# Patient Record
Sex: Female | Born: 1990
Health system: Southern US, Community
[De-identification: ages and names within clinical notes are randomized; demographics above are authoritative.]

## PROBLEM LIST (undated history)

## (undated) DIAGNOSIS — B009 Herpesviral infection, unspecified: Secondary | ICD-10-CM

## (undated) DIAGNOSIS — F419 Anxiety disorder, unspecified: Secondary | ICD-10-CM

## (undated) DIAGNOSIS — E079 Disorder of thyroid, unspecified: Secondary | ICD-10-CM

## (undated) DIAGNOSIS — F32A Depression, unspecified: Secondary | ICD-10-CM

## (undated) DIAGNOSIS — F329 Major depressive disorder, single episode, unspecified: Secondary | ICD-10-CM

## (undated) DIAGNOSIS — Z1159 Encounter for screening for other viral diseases: Secondary | ICD-10-CM

## (undated) HISTORY — PX: TONSILLECTOMY: SUR1361

## (undated) HISTORY — PX: TONSILECTOMY, ADENOIDECTOMY, BILATERAL MYRINGOTOMY AND TUBES: SHX2538

## (undated) HISTORY — DX: Encounter for screening for other viral diseases: Z11.59

## (undated) HISTORY — DX: Depression, unspecified: F32.A

## (undated) HISTORY — DX: Major depressive disorder, single episode, unspecified: F32.9

## (undated) HISTORY — DX: Anxiety disorder, unspecified: F41.9

## (undated) HISTORY — DX: Herpesviral infection, unspecified: B00.9

---
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
## 2014-08-11 ENCOUNTER — Ambulatory Visit
Admit: 2014-08-11 | Disposition: A | Discharge status: Home / Self Care | Payer: Self-pay | Attending: Unknown Physician Specialty | Admitting: Unknown Physician Specialty

## 2014-08-11 ENCOUNTER — Other Ambulatory Visit: Payer: Self-pay

## 2014-09-04 LAB — SURGICAL PATHOLOGY

## 2015-07-25 DIAGNOSIS — Z124 Encounter for screening for malignant neoplasm of cervix: Secondary | ICD-10-CM | POA: Diagnosis not present

## 2015-07-25 DIAGNOSIS — N63 Unspecified lump in breast: Secondary | ICD-10-CM | POA: Diagnosis not present

## 2015-07-26 DIAGNOSIS — Z01419 Encounter for gynecological examination (general) (routine) without abnormal findings: Secondary | ICD-10-CM | POA: Diagnosis not present

## 2015-08-06 DIAGNOSIS — Z30430 Encounter for insertion of intrauterine contraceptive device: Secondary | ICD-10-CM | POA: Diagnosis not present

## 2016-02-14 DIAGNOSIS — Z87898 Personal history of other specified conditions: Secondary | ICD-10-CM | POA: Diagnosis not present

## 2016-02-14 DIAGNOSIS — R110 Nausea: Secondary | ICD-10-CM | POA: Diagnosis not present

## 2016-03-11 DIAGNOSIS — F411 Generalized anxiety disorder: Secondary | ICD-10-CM | POA: Diagnosis not present

## 2016-03-11 DIAGNOSIS — J019 Acute sinusitis, unspecified: Secondary | ICD-10-CM | POA: Diagnosis not present

## 2016-06-01 DIAGNOSIS — M791 Myalgia: Secondary | ICD-10-CM | POA: Diagnosis not present

## 2016-06-01 DIAGNOSIS — J101 Influenza due to other identified influenza virus with other respiratory manifestations: Secondary | ICD-10-CM | POA: Diagnosis not present

## 2016-06-16 DIAGNOSIS — F411 Generalized anxiety disorder: Secondary | ICD-10-CM | POA: Diagnosis not present

## 2016-06-16 DIAGNOSIS — F331 Major depressive disorder, recurrent, moderate: Secondary | ICD-10-CM | POA: Diagnosis not present

## 2016-06-16 DIAGNOSIS — R002 Palpitations: Secondary | ICD-10-CM | POA: Diagnosis not present

## 2016-06-16 DIAGNOSIS — R5383 Other fatigue: Secondary | ICD-10-CM | POA: Diagnosis not present

## 2016-08-06 DIAGNOSIS — F411 Generalized anxiety disorder: Secondary | ICD-10-CM | POA: Diagnosis not present

## 2016-08-06 DIAGNOSIS — F331 Major depressive disorder, recurrent, moderate: Secondary | ICD-10-CM | POA: Diagnosis not present

## 2017-04-13 DIAGNOSIS — W108XXA Fall (on) (from) other stairs and steps, initial encounter: Secondary | ICD-10-CM | POA: Diagnosis not present

## 2017-04-13 DIAGNOSIS — M25512 Pain in left shoulder: Secondary | ICD-10-CM | POA: Diagnosis not present

## 2017-04-13 DIAGNOSIS — S4992XA Unspecified injury of left shoulder and upper arm, initial encounter: Secondary | ICD-10-CM | POA: Diagnosis not present

## 2017-04-14 NOTE — L&D Delivery Note (Signed)
Delivery Note At  1043 a viable and healthy female "Cassandra Stephens"  was delivered via  (Presentation:LOA ;  ).  APGAR: 9,10 ;   .   Placenta status: delivered intact with 3 vessel  Cord:  with the following complications: none  Anesthesia:  epidural Episiotomy:  none Lacerations:  bilateral superficial perineal (no repear needed) Suture Repair: NA Est. Blood Loss (mL):  300  Mom to postpartum.  Baby to Couplet care / Skin to Skin.  Melody N Shambley 03/31/2018, 10:57 AM

## 2017-06-24 DIAGNOSIS — Z01419 Encounter for gynecological examination (general) (routine) without abnormal findings: Secondary | ICD-10-CM | POA: Diagnosis not present

## 2017-06-24 DIAGNOSIS — Z124 Encounter for screening for malignant neoplasm of cervix: Secondary | ICD-10-CM | POA: Diagnosis not present

## 2017-06-24 DIAGNOSIS — N898 Other specified noninflammatory disorders of vagina: Secondary | ICD-10-CM | POA: Diagnosis not present

## 2017-06-25 DIAGNOSIS — Z13220 Encounter for screening for lipoid disorders: Secondary | ICD-10-CM | POA: Diagnosis not present

## 2017-06-25 DIAGNOSIS — Z1321 Encounter for screening for nutritional disorder: Secondary | ICD-10-CM | POA: Diagnosis not present

## 2017-06-25 DIAGNOSIS — Z136 Encounter for screening for cardiovascular disorders: Secondary | ICD-10-CM | POA: Diagnosis not present

## 2017-06-25 DIAGNOSIS — Z131 Encounter for screening for diabetes mellitus: Secondary | ICD-10-CM | POA: Diagnosis not present

## 2017-06-25 DIAGNOSIS — Z1329 Encounter for screening for other suspected endocrine disorder: Secondary | ICD-10-CM | POA: Diagnosis not present

## 2017-06-29 ENCOUNTER — Ambulatory Visit: Payer: BLUE CROSS/BLUE SHIELD | Admitting: Nurse Practitioner

## 2017-06-29 ENCOUNTER — Encounter: Payer: Self-pay | Admitting: Nurse Practitioner

## 2017-06-29 VITALS — BP 122/78 | HR 115 | Temp 98.4°F | Resp 16 | Ht 64.0 in | Wt 146.0 lb

## 2017-06-29 DIAGNOSIS — R319 Hematuria, unspecified: Secondary | ICD-10-CM

## 2017-06-29 DIAGNOSIS — R300 Dysuria: Secondary | ICD-10-CM | POA: Diagnosis not present

## 2017-06-29 DIAGNOSIS — N390 Urinary tract infection, site not specified: Secondary | ICD-10-CM | POA: Diagnosis not present

## 2017-06-29 DIAGNOSIS — J029 Acute pharyngitis, unspecified: Secondary | ICD-10-CM | POA: Insufficient documentation

## 2017-06-29 LAB — POCT URINALYSIS DIPSTICK
Bilirubin, UA: NEGATIVE
Glucose, UA: NEGATIVE
Nitrite, UA: NEGATIVE
Spec Grav, UA: 1.01 (ref 1.010–1.025)
Urobilinogen, UA: 0.2 E.U./dL
pH, UA: 6.5 (ref 5.0–8.0)

## 2017-06-29 MED ORDER — PHENAZOPYRIDINE HCL 200 MG PO TABS: 200.0000 mg | ORAL_TABLET | Freq: Three times a day (TID) | ORAL | 0 refills | Status: DC | PRN

## 2017-06-29 MED ORDER — AMOXICILLIN-POT CLAVULANATE 875-125 MG PO TABS: 1.0000 | ORAL_TABLET | Freq: Two times a day (BID) | ORAL | 0 refills | Status: DC

## 2017-06-29 NOTE — Progress Notes (Signed)
[facility] [address] [place], [place] [address]  Internal MEDICINE  Office Visit Note  Patient Name: Cassandra HarrisonHannah N Stephens  [birth year]  [id number]  Date of Service: 06/29/2017  Chief Complaint  Patient presents with  . Urinary Tract Infection  . Back Pain  . Chills  . Sore Throat     The patient is here for sick visit. Bladder and lower abdomen have been causing discomfort for several hours. Hurt to urinate. Did not notice any blood in urine. Has no nausea or vomiting .Does have back pain and a sore throat, but no fever.    Pt is here for a sick visit.     Current Medication:  Outpatient Encounter Medications as of 06/29/2017  Medication Sig  . ALPRAZolam (XANAX) 0.25 MG tablet Take 0.25 mg by mouth at bedtime as needed for anxiety.  . Prenatal Vit-Fe Fumarate-FA (PRENATAL MULTIVITAMIN) TABS tablet Take 1 tablet by mouth daily at 12 noon.  Marland Kitchen. amoxicillin-clavulanate (AUGMENTIN) 875-125 MG tablet Take 1 tablet by mouth 2 (two) times daily.  . phenazopyridine (PYRIDIUM) 200 MG tablet Take 1 tablet (200 mg total) by mouth 3 (three) times daily as needed for pain.   No facility-administered encounter medications on file as of 06/29/2017.       Medical History: Past Medical History:  Diagnosis Date  . Anxiety   . Depression      Today's Vitals   06/29/17 1003  BP: 122/78  Pulse: (!) 115  Resp: 16  Temp: 98.4 F (36.9 C)  SpO2: 97%  Weight: 146 lb (66.2 kg)  Height: 5\' 4"  (1.626 m)    Review of Systems  Constitutional: Positive for appetite change, chills and fatigue. Negative for unexpected weight change.  HENT: Positive for postnasal drip and sore throat. Negative for congestion, rhinorrhea and sneezing.   Eyes: Negative.  Negative for redness.  Respiratory: Negative for cough, chest tightness and shortness of breath.   Cardiovascular: Negative for chest pain and palpitations.  Gastrointestinal: Positive for abdominal pain. Negative for  constipation, diarrhea, nausea and vomiting.  Endocrine: Negative for cold intolerance, heat intolerance, polydipsia, polyphagia and polyuria.  Genitourinary: Positive for dysuria, flank pain, frequency and hematuria.  Musculoskeletal: Positive for back pain. Negative for arthralgias, joint swelling and neck pain.  Skin: Negative for rash.  Allergic/Immunologic: Negative for environmental allergies.  Neurological: Positive for headaches. Negative for tremors and numbness.  Hematological: Negative for adenopathy. Does not bruise/bleed easily.  Psychiatric/Behavioral: Negative for behavioral problems (Depression), sleep disturbance and suicidal ideas. The patient is not nervous/anxious.     Physical Exam  Constitutional: She is oriented to person, place, and time. She appears well-developed and well-nourished. No distress.  HENT:  Head: Normocephalic and atraumatic.  Mouth/Throat: Posterior oropharyngeal erythema present. No oropharyngeal exudate.  Eyes: EOM are normal. Pupils are equal, round, and reactive to light.  Neck: Normal range of motion. Neck supple. No JVD present. No tracheal deviation present. No thyromegaly present.  Cardiovascular: Normal rate, regular rhythm and normal heart sounds. Exam reveals no gallop and no friction rub.  No murmur heard. Pulmonary/Chest: Effort normal and breath sounds normal. No respiratory distress. She has no wheezes. She has no rales. She exhibits no tenderness.  Abdominal: Soft. Bowel sounds are normal.  Genitourinary:  Genitourinary Comments: Urine sample positive for large blood and small WBC  Musculoskeletal: Normal range of motion.  Lymphadenopathy:    She has cervical adenopathy.  Neurological: She is alert and oriented to person, place, and time.  No cranial nerve deficit.  Skin: Skin is warm and dry. She is not diaphoretic.  Psychiatric: She has a normal mood and affect. Her behavior is normal. Judgment and thought content normal.  Nursing  note and vitals reviewed.   Assessment/Plan: 1. Urinary tract infection with hematuria, site unspecified Start augmentin 875mg  bid for 10 days. Adjust as indicated after culture results available.  - CULTURE, URINE COMPREHENSIVE - amoxicillin-clavulanate (AUGMENTIN) 875-125 MG tablet; Take 1 tablet by mouth 2 (two) times daily.  Dispense: 20 tablet; Refill: 0  2. Dysuria Pyridium 200mg  may be used up to 3 times daily as needed for bladder pain and spasms.  - POCT Urinalysis Dipstick - phenazopyridine (PYRIDIUM) 200 MG tablet; Take 1 tablet (200 mg total) by mouth 3 (three) times daily as needed for pain.  Dispense: 10 tablet; Refill: 0  3. Pharyngitis, unspecified etiology Recommended she gargle with warm salt water as needed. Take tylenol and ibuprofen for pain. Rest and increase fluids.  General Counseling: darianny momon understanding of the findings of todays visit and agrees with plan of treatment. I have discussed any further diagnostic evaluation that may be needed or ordered today. We also reviewed her medications today. she has been encouraged to call the office with any questions or concerns that should arise related to todays visit.  This patient was seen by Vincent Gros, FNP- C in Collaboration with Dr Lyndon Code as a part of collaborative care agreement   Orders Placed This Encounter  Procedures  . CULTURE, URINE COMPREHENSIVE  . POCT Urinalysis Dipstick    Meds ordered this encounter  Medications  . phenazopyridine (PYRIDIUM) 200 MG tablet    Sig: Take 1 tablet (200 mg total) by mouth 3 (three) times daily as needed for pain.    Dispense:  10 tablet    Refill:  0    Order Specific Question:   Supervising Provider    Answer:   Lyndon Code [[id number]]  . amoxicillin-clavulanate (AUGMENTIN) 875-125 MG tablet    Sig: Take 1 tablet by mouth 2 (two) times daily.    Dispense:  20 tablet    Refill:  0    Order Specific Question:   Supervising Provider    Answer:    Lyndon Code [[id number]]    Time spent: 15 Minutes

## 2017-07-02 LAB — CULTURE, URINE COMPREHENSIVE

## 2017-07-30 DIAGNOSIS — Z0183 Encounter for blood typing: Secondary | ICD-10-CM | POA: Diagnosis not present

## 2017-07-30 DIAGNOSIS — N912 Amenorrhea, unspecified: Secondary | ICD-10-CM | POA: Diagnosis not present

## 2017-07-31 LAB — OB RESULTS CONSOLE ABO/RH: RH Type: POSITIVE

## 2017-07-31 LAB — OB RESULTS CONSOLE HGB/HCT, BLOOD
HCT: 40
Hemoglobin: 13.1

## 2017-07-31 LAB — OB RESULTS CONSOLE PLATELET COUNT: Platelets: 240

## 2017-07-31 LAB — OB RESULTS CONSOLE TSH: TSH: 4.11

## 2017-08-06 DIAGNOSIS — N912 Amenorrhea, unspecified: Secondary | ICD-10-CM | POA: Diagnosis not present

## 2017-08-18 DIAGNOSIS — N912 Amenorrhea, unspecified: Secondary | ICD-10-CM | POA: Diagnosis not present

## 2017-09-09 DIAGNOSIS — N912 Amenorrhea, unspecified: Secondary | ICD-10-CM | POA: Diagnosis not present

## 2017-09-21 ENCOUNTER — Ambulatory Visit (INDEPENDENT_AMBULATORY_CARE_PROVIDER_SITE_OTHER): Payer: BLUE CROSS/BLUE SHIELD | Admitting: Certified Nurse Midwife

## 2017-09-21 ENCOUNTER — Encounter: Payer: Self-pay | Admitting: Certified Nurse Midwife

## 2017-09-21 VITALS — BP 114/70 | HR 90 | Wt 152.5 lb

## 2017-09-21 DIAGNOSIS — Z3A12 12 weeks gestation of pregnancy: Secondary | ICD-10-CM | POA: Diagnosis not present

## 2017-09-21 NOTE — Progress Notes (Signed)
NEW OB HISTORY AND PHYSICAL  SUBJECTIVE:       Cassandra Stephens is a 27 y.o. No obstetric history on file. female, No LMP recorded (lmp unknown). Patient is pregnant., Estimated Date of Delivery: None noted., Unknown, presents today for establishment of Prenatal Care, [facility] PN records received.  She has no unusual complaints     Gynecologic History No LMP recorded (lmp unknown). Patient is pregnant. Unknown, u/s used for dating  Contraception: none Last Pap: 06/24/17. Results were: normal  Obstetric History OB History  Gravida Para Term Preterm AB Living  1            SAB TAB Ectopic Multiple Live Births               # Outcome Date GA Lbr Len/2nd Weight Sex Delivery Anes PTL Lv  1 Current             Past Medical History:  Diagnosis Date  . Anxiety   . Depression   . Herpes simplex virus (HSV) type I or type II DNA not detected by PCR     Past Surgical History:  Procedure Laterality Date  . TONSILLECTOMY Bilateral     Current Outpatient Medications on File Prior to Visit  Medication Sig Dispense Refill  . ALPRAZolam (XANAX) 0.25 MG tablet Take 0.25 mg by mouth at bedtime as needed for anxiety.    Marland Kitchen. amoxicillin-clavulanate (AUGMENTIN) 875-125 MG tablet Take 1 tablet by mouth 2 (two) times daily. 20 tablet 0  . phenazopyridine (PYRIDIUM) 200 MG tablet Take 1 tablet (200 mg total) by mouth 3 (three) times daily as needed for pain. 10 tablet 0  . Prenatal Vit-Fe Fumarate-FA (PRENATAL MULTIVITAMIN) TABS tablet Take 1 tablet by mouth daily at 12 noon.     No current facility-administered medications on file prior to visit.     No Known Allergies  Social History   Socioeconomic History  . Marital status: Single    Spouse name: Not on file  . Number of children: Not on file  . Years of education: Not on file  . Highest education level: Not on file  Occupational History  . Not on file  Social Needs  . Financial resource strain: Not on  file  . Food insecurity:    Worry: Not on file    Inability: Not on file  . Transportation needs:    Medical: Not on file    Non-medical: Not on file  Tobacco Use  . Smoking status: Never Smoker  . Smokeless tobacco: Never Used  Substance and Sexual Activity  . Alcohol use: Never    Frequency: Never  . Drug use: Never  . Sexual activity: Yes    Birth control/protection: None  Lifestyle  . Physical activity:    Days per week: Not on file    Minutes per session: Not on file  . Stress: Not on file  Relationships  . Social connections:    Talks on phone: Not on file    Gets together: Not on file    Attends religious service: Not on file    Active member of club or organization: Not on file    Attends meetings of clubs or organizations: Not on file    Relationship status: Not on file  . Intimate partner violence:    Fear of current or ex partner: Not on file    Emotionally abused: Not on file    Physically abused: Not on file  Forced sexual activity: Not on file  Other Topics Concern  . Not on file  Social History Narrative  . Not on file    Family History  Problem Relation Age of Onset  . Hyperlipidemia Father   . Hypertension Father   . Heart attack Father   . Diabetes Maternal Grandfather   . Diabetes Paternal Grandfather   . Heart disease Paternal Grandfather     The following portions of the patient's history were reviewed and updated as appropriate: allergies, current medications, past OB history, past medical history, past surgical history, past family history, past social history, and problem list.    OBJECTIVE: Initial Physical Exam (New OB)  GENERAL APPEARANCE: alert, well appearing, in no apparent distress, oriented to person, place and time HEAD: normocephalic, atraumatic MOUTH: mucous membranes moist, pharynx normal without lesions THYROID: no thyromegaly or masses present BREASTS: no masses noted, no significant tenderness, no palpable axillary  nodes, no skin changes LUNGS: clear to auscultation, no wheezes, rales or rhonchi, symmetric air entry HEART: regular rate and rhythm, no murmurs ABDOMEN: soft, nontender, nondistended, no abnormal masses, no epigastric pain EXTREMITIES: no redness or tenderness in the calves or thighs, no edema, no limitation in range of motion, intact peripheral pulses SKIN: normal coloration and turgor, no rashes LYMPH NODES: no adenopathy palpable NEUROLOGIC: alert, oriented, normal speech, no focal findings or movement disorder noted  PELVIC EXAM EXTERNAL GENITALIA: normal appearing vulva with no masses, tenderness or lesions VAGINA: no abnormal discharge or lesions CERVIX: no lesions or cervical motion tenderness UTERUS: gravid and consistent with 12 weeks ADNEXA: no masses palpable and nontender OB EXAM PELVIMETRY: appears adequate RECTUM: exam not indicated  ASSESSMENT: Normal pregnancy  PLAN: New OB counseling: The patient has been given an overview regarding routine prenatal care. Recommendations regarding diet, weight gain, and exercise in pregnancy were given. Prenatal testing, optional genetic testing, and ultrasound use in pregnancy were reviewed. PT states she had a gender test done " boy". Benefits of Breast Feeding were discussed. The patient is encouraged to consider nursing her baby post partum.  Doreene Burke, CNM

## 2017-09-21 NOTE — Progress Notes (Signed)
New pt is here for a NOB physical. [facility].

## 2017-09-21 NOTE — Patient Instructions (Signed)
Eating Plan for Pregnant Women While you are pregnant, your body will require additional nutrition to help support your growing baby. It is recommended that you consume:  150 additional calories each day during your first trimester.  300 additional calories each day during your second trimester.  300 additional calories each day during your third trimester.  Eating a healthy, well-balanced diet is very important for your health and for your baby's health. You also have a higher need for some vitamins and minerals, such as folic acid, calcium, iron, and vitamin D. What do I need to know about eating during pregnancy?  Do not try to lose weight or go on a diet during pregnancy.  Choose healthy, nutritious foods. Choose  of a sandwich with a glass of milk instead of a candy bar or a high-calorie sugar-sweetened beverage.  Limit your overall intake of foods that have "empty calories." These are foods that have little nutritional value, such as sweets, desserts, candies, sugar-sweetened beverages, and fried foods.  Eat a variety of foods, especially fruits and vegetables.  Take a prenatal vitamin to help meet the additional needs during pregnancy, specifically for folic acid, iron, calcium, and vitamin D.  Remember to stay active. Ask your health care provider for exercise recommendations that are specific to you.  Practice good food safety and cleanliness, such as washing your hands before you eat and after you prepare raw meat. This helps to prevent foodborne illnesses, such as listeriosis, that can be very dangerous for your baby. Ask your health care provider for more information about listeriosis. What does 150 extra calories look like? Healthy options for an additional 150 calories each day could be any of the following:  Plain low-fat yogurt (6-8 oz) with  cup of berries.  1 apple with 2 teaspoons of peanut butter.  Cut-up vegetables with  cup of hummus.  Low-fat chocolate milk  (8 oz or 1 cup).  1 string cheese with 1 medium orange.   of a peanut butter and jelly sandwich on whole-wheat bread (1 tsp of peanut butter).  For 300 calories, you could eat two of those healthy options each day. What is a healthy amount of weight to gain? The recommended amount of weight for you to gain is based on your pre-pregnancy BMI. If your pre-pregnancy BMI was:  Less than 18 (underweight), you should gain 28-40 lb.  18-24.9 (normal), you should gain 25-35 lb.  25-29.9 (overweight), you should gain 15-25 lb.  Greater than 30 (obese), you should gain 11-20 lb.  What if I am having twins or multiples? Generally, pregnant women who will be having twins or multiples may need to increase their daily calories by 300-600 calories each day. The recommended range for total weight gain is 25-54 lb, depending on your pre-pregnancy BMI. Talk with your health care provider for specific guidance about additional nutritional needs, weight gain, and exercise during your pregnancy. What foods can I eat? Grains Any grains. Try to choose whole grains, such as whole-wheat bread, oatmeal, or brown rice. Vegetables Any vegetables. Try to eat a variety of colors and types of vegetables to get a full range of vitamins and minerals. Remember to wash your vegetables well before eating. Fruits Any fruits. Try to eat a variety of colors and types of fruit to get a full range of vitamins and minerals. Remember to wash your fruits well before eating. Meats and Other Protein Sources Lean meats, including chicken, turkey, fish, and lean cuts of beef, veal,   or pork. Make sure that all meats are cooked to "well done." Tofu. Tempeh. Beans. Eggs. Peanut butter and other nut butters. Seafood, such as shrimp, crab, and lobster. If you choose fish, select types that are higher in omega-3 fatty acids, including salmon, herring, mussels, trout, sardines, and pollock. Make sure that all meats are cooked to food-safe  temperatures. Dairy Pasteurized milk and milk alternatives. Pasteurized yogurt and pasteurized cheese. Cottage cheese. Sour cream. Beverages Water. Juices that contain 100% fruit juice or vegetable juice. Caffeine-free teas and decaffeinated coffee. Drinks that contain caffeine are okay to drink, but it is better to avoid caffeine. Keep your total caffeine intake to less than 200 mg each day (12 oz of coffee, tea, or soda) or as directed by your health care provider. Condiments Any pasteurized condiments. Sweets and Desserts Any sweets and desserts. Fats and Oils Any fats and oils. The items listed above may not be a complete list of recommended foods or beverages. Contact your dietitian for more options. What foods are not recommended? Vegetables Unpasteurized (raw) vegetable juices. Fruits Unpasteurized (raw) fruit juices. Meats and Other Protein Sources Cured meats that have nitrates, such as bacon, salami, and hotdogs. Luncheon meats, bologna, or other deli meats (unless they are reheated until they are steaming hot). Refrigerated pate, meat spreads from a meat counter, smoked seafood that is found in the refrigerated section of a store. Raw fish, such as sushi or sashimi. High mercury content fish, such as tilefish, shark, swordfish, and king mackerel. Raw meats, such as tuna or beef tartare. Undercooked meats and poultry. Make sure that all meats are cooked to food-safe temperatures. Dairy Unpasteurized (raw) milk and any foods that have raw milk in them. Soft cheeses, such as feta, queso blanco, queso fresco, Brie, Camembert cheeses, blue-veined cheeses, and Panela cheese (unless it is made with pasteurized milk, which must be stated on the label). Beverages Alcohol. Sugar-sweetened beverages, such as sodas, teas, or energy drinks. Condiments Homemade fermented foods and drinks, such as pickles, sauerkraut, or kombucha drinks. (Store-bought pasteurized versions of these are  okay.) Other Salads that are made in the store, such as ham salad, chicken salad, egg salad, tuna salad, and seafood salad. The items listed above may not be a complete list of foods and beverages to avoid. Contact your dietitian for more information. This information is not intended to replace advice given to you by your health care provider. Make sure you discuss any questions you have with your health care provider. Document Released: 01/13/2014 Document Revised: 09/06/2015 Document Reviewed: 09/13/2013 [employer] Interactive Patient Education  2018 [employer] Prenatal Care WHAT IS PRENATAL CARE? Prenatal care is the process of caring for a pregnant woman before she gives birth. Prenatal care makes sure that she and her baby remain as healthy as possible throughout pregnancy. Prenatal care may be provided by a midwife, family practice health care provider, or a childbirth and pregnancy specialist (obstetrician). Prenatal care may include physical examinations, testing, treatments, and education on nutrition, lifestyle, and social support services. WHY IS PRENATAL CARE SO IMPORTANT? Early and consistent prenatal care increases the chance that you and your baby will remain healthy throughout your pregnancy. This type of care also decreases a baby's risk of being born too early (prematurely), or being born smaller than expected (small for gestational age). Any underlying medical conditions you may have that could pose a risk during your pregnancy are discussed during prenatal care visits. You will also be monitored regularly for any new   conditions that may arise during your pregnancy so they can be treated quickly and effectively. WHAT HAPPENS DURING PRENATAL CARE VISITS? Prenatal care visits may include the following: Discussion Tell your health care provider about any new signs or symptoms you have experienced since your last visit. These might include:  Nausea or vomiting.  Increased or  decreased level of energy.  Difficulty sleeping.  Back or leg pain.  Weight changes.  Frequent urination.  Shortness of breath with physical activity.  Changes in your skin, such as the development of a rash or itchiness.  Vaginal discharge or bleeding.  Feelings of excitement or nervousness.  Changes in your baby's movements.  You may want to write down any questions or topics you want to discuss with your health care provider and bring them with you to your appointment. Examination During your first prenatal care visit, you will likely have a complete physical exam. Your health care provider will often examine your vagina, cervix, and the position of your uterus, as well as check your heart, lungs, and other body systems. As your pregnancy progresses, your health care provider will measure the size of your uterus and your baby's position inside your uterus. He or she may also examine you for early signs of labor. Your prenatal visits may also include checking your blood pressure and, after about 10-12 weeks of pregnancy, listening to your baby's heartbeat. Testing Regular testing often includes:  Urinalysis. This checks your urine for glucose, protein, or signs of infection.  Blood count. This checks the levels of white and red blood cells in your body.  Tests for sexually transmitted infections (STIs). Testing for STIs at the beginning of pregnancy is routinely done and is required in many states.  Antibody testing. You will be checked to see if you are immune to certain illnesses, such as rubella, that can affect a developing fetus.  Glucose screen. Around 24-28 weeks of pregnancy, your blood glucose level will be checked for signs of gestational diabetes. Follow-up tests may be recommended.  Group B strep. This is a bacteria that is commonly found inside a woman's vagina. This test will inform your health care provider if you need an antibiotic to reduce the amount of this  bacteria in your body prior to labor and childbirth.  Ultrasound. Many pregnant women undergo an ultrasound screening around 18-20 weeks of pregnancy to evaluate the health of the fetus and check for any developmental abnormalities.  HIV (human immunodeficiency virus) testing. Early in your pregnancy, you will be screened for HIV. If you are at high risk for HIV, this test may be repeated during your third trimester of pregnancy.  You may be offered other testing based on your age, personal or family medical history, or other factors. HOW OFTEN SHOULD I PLAN TO SEE MY HEALTH CARE PROVIDER FOR PRENATAL CARE? Your prenatal care check-up schedule depends on any medical conditions you have before, or develop during, your pregnancy. If you do not have any underlying medical conditions, you will likely be seen for checkups:  Monthly, during the first 6 months of pregnancy.  Twice a month during months 7 and 8 of pregnancy.  Weekly starting in the 9th month of pregnancy and until delivery.  If you develop signs of early labor or other concerning signs or symptoms, you may need to see your health care provider more often. Ask your health care provider what prenatal care schedule is best for you. WHAT CAN I DO TO KEEP MYSELF AND   MY BABY AS HEALTHY AS POSSIBLE DURING MY PREGNANCY?  Take a prenatal vitamin containing 400 micrograms (0.4 mg) of folic acid every day. Your health care provider may also ask you to take additional vitamins such as iodine, vitamin D, iron, copper, and zinc.  Take 1500-2000 mg of calcium daily starting at your 20th week of pregnancy until you deliver your baby.  Make sure you are up to date on your vaccinations. Unless directed otherwise by your health care provider: ? You should receive a tetanus, diphtheria, and pertussis (Tdap) vaccination between the 27th and 36th week of your pregnancy, regardless of when your last Tdap immunization occurred. This helps protect your baby  from whooping cough (pertussis) after he or she is born. ? You should receive an annual inactivated influenza vaccine (IIV) to help protect you and your baby from influenza. This can be done at any point during your pregnancy.  Eat a well-rounded diet that includes: ? Fresh fruits and vegetables. ? Lean proteins. ? Calcium-rich foods such as milk, yogurt, hard cheeses, and dark, leafy greens. ? Whole grain breads.  Do noteat seafood high in mercury, including: ? Swordfish. ? Tilefish. ? Shark. ? King mackerel. ? More than 6 oz tuna per week.  Do not eat: ? Raw or undercooked meats or eggs. ? Unpasteurized foods, such as soft cheeses (brie, blue, or feta), juices, and milks. ? Lunch meats. ? Hot dogs that have not been heated until they are steaming.  Drink enough water to keep your urine clear or pale yellow. For many women, this may be 10 or more 8 oz glasses of water each day. Keeping yourself hydrated helps deliver nutrients to your baby and may prevent the start of pre-term uterine contractions.  Do not use any tobacco products including cigarettes, chewing tobacco, or electronic cigarettes. If you need help quitting, ask your health care provider.  Do not drink beverages containing alcohol. No safe level of alcohol consumption during pregnancy has been determined.  Do not use any illegal drugs. These can harm your developing baby or cause a miscarriage.  Ask your health care provider or pharmacist before taking any prescription or over-the-counter medicines, herbs, or supplements.  Limit your caffeine intake to no more than 200 mg per day.  Exercise. Unless told otherwise by your health care provider, try to get 30 minutes of moderate exercise most days of the week. Do not  do high-impact activities, contact sports, or activities with a high risk of falling, such as horseback riding or downhill skiing.  Get plenty of rest.  Avoid anything that raises your body temperature,  such as hot tubs and saunas.  If you own a cat, do not empty its litter box. Bacteria contained in cat feces can cause an infection called toxoplasmosis. This can result in serious harm to the fetus.  Stay away from chemicals such as insecticides, lead, mercury, and cleaning or paint products that contain solvents.  Do not have any X-rays taken unless medically necessary.  Take a childbirth and breastfeeding preparation class. Ask your health care provider if you need a referral or recommendation.  This information is not intended to replace advice given to you by your health care provider. Make sure you discuss any questions you have with your health care provider. Document Released: 04/03/2003 Document Revised: 09/03/2015 Document Reviewed: 06/15/2013 [employer] Interactive Patient Education  2017 [employer]  

## 2017-10-08 ENCOUNTER — Encounter: Payer: Self-pay | Admitting: Certified Nurse Midwife

## 2017-10-08 DIAGNOSIS — L03213 Periorbital cellulitis: Secondary | ICD-10-CM | POA: Diagnosis not present

## 2017-10-09 ENCOUNTER — Encounter (INDEPENDENT_AMBULATORY_CARE_PROVIDER_SITE_OTHER): Payer: Self-pay

## 2017-10-22 ENCOUNTER — Ambulatory Visit (INDEPENDENT_AMBULATORY_CARE_PROVIDER_SITE_OTHER): Payer: BLUE CROSS/BLUE SHIELD | Admitting: Certified Nurse Midwife

## 2017-10-22 VITALS — BP 97/62 | HR 71 | Wt 159.2 lb

## 2017-10-22 DIAGNOSIS — Z3402 Encounter for supervision of normal first pregnancy, second trimester: Secondary | ICD-10-CM | POA: Diagnosis not present

## 2017-10-22 DIAGNOSIS — Z3482 Encounter for supervision of other normal pregnancy, second trimester: Secondary | ICD-10-CM | POA: Diagnosis not present

## 2017-10-22 NOTE — Progress Notes (Signed)
Pt is here for an ROB visit. 

## 2017-10-22 NOTE — Progress Notes (Signed)
ROB-Doing well, no questions or concerns. Anticipatory guidance regarding the course of prenatal care. Reviewed red flag symptoms and when to call. RTC x 4 weeks for anatomy scan and ROB or sooner if needed.

## 2017-10-22 NOTE — Patient Instructions (Signed)
Common Medications Safe in Pregnancy  Acne:      Constipation:  Benzoyl Peroxide     Colace  Clindamycin      Dulcolax Suppository  Topica Erythromycin     Fibercon  Salicylic Acid      Metamucil         Miralax AVOID:        Senakot   Accutane    Cough:  Retin-A       Cough Drops  Tetracycline      Phenergan w/ Codeine if Rx  Minocycline      Robitussin (Plain & DM)  Antibiotics:     Crabs/Lice:  Ceclor       RID  Cephalosporins    AVOID:  E-Mycins      Kwell  Keflex  Macrobid/Macrodantin   Diarrhea:  Penicillin      Kao-Pectate  Zithromax      Imodium AD         PUSH FLUIDS AVOID:       Cipro     Fever:  Tetracycline      Tylenol (Regular or Extra  Minocycline       Strength)  Levaquin      Extra Strength-Do not          Exceed 8 tabs/24 hrs Caffeine:        <200mg/day (equiv. To 1 cup of coffee or  approx. 3 12 oz sodas)         Gas: Cold/Hayfever:       Gas-X  Benadryl      Mylicon  Claritin       Phazyme  **Claritin-D        Chlor-Trimeton    Headaches:  Dimetapp      ASA-Free Excedrin  Drixoral-Non-Drowsy     Cold Compress  Mucinex (Guaifenasin)     Tylenol (Regular or Extra  Sudafed/Sudafed-12 Hour     Strength)  **Sudafed PE Pseudoephedrine   Tylenol Cold & Sinus     Vicks Vapor Rub  Zyrtec  **AVOID if Problems With Blood Pressure         Heartburn: Avoid lying down for at least 1 hour after meals  Aciphex      Maalox     Rash:  Milk of Magnesia     Benadryl    Mylanta       1% Hydrocortisone Cream  Pepcid  Pepcid Complete   Sleep Aids:  Prevacid      Ambien   Prilosec       Benadryl  Rolaids       Chamomile Tea  Tums (Limit 4/day)     Unisom  Zantac       Tylenol PM         Warm milk-add vanilla or  Hemorrhoids:       Sugar for taste  Anusol/Anusol H.C.  (RX: Analapram 2.5%)  Sugar Substitutes:  Hydrocortisone OTC     Ok in moderation  Preparation H      Tucks        Vaseline lotion applied to tissue with  wiping    Herpes:     Throat:  Acyclovir      Oragel  Famvir  Valtrex     Vaccines:         Flu Shot Leg Cramps:       *Gardasil  Benadryl      Hepatitis A         Hepatitis B Nasal Spray:         Pneumovax  Saline Nasal Spray     Polio Booster         Tetanus Nausea:       Tuberculosis test or PPD  Vitamin B6 25 mg TID   AVOID:    Dramamine      *Gardasil  Emetrol       Live Poliovirus  Ginger Root 250 mg QID    MMR (measles, mumps &  High Complex Carbs @ Bedtime    rebella)  Sea Bands-Accupressure    Varicella (Chickenpox)  Unisom 1/2 tab TID     *No known complications           If received before Pain:         Known pregnancy;   Darvocet       Resume series after  Lortab        Delivery  Percocet    Yeast:   Tramadol      Femstat  Tylenol 3      Gyne-lotrimin  Ultram       Monistat  Vicodin           MISC:         All Sunscreens           Hair Coloring/highlights          Insect Repellant's          (Including DEET)         Mystic Tans Back Pain in Pregnancy Back pain during pregnancy is common. Back pain may be caused by several factors that are related to changes during your pregnancy. Follow these instructions at home: Managing pain, stiffness, and swelling  If directed, apply ice for sudden (acute) back pain. ? Put ice in a plastic bag. ? Place a towel between your skin and the bag. ? Leave the ice on for 20 minutes, 2-3 times per day.  If directed, apply heat to the affected area before you exercise: ? Place a towel between your skin and the heat pack or heating pad. ? Leave the heat on for 20-30 minutes. ? Remove the heat if your skin turns bright red. This is especially important if you are unable to feel pain, heat, or cold. You may have a greater risk of getting burned. Activity  Exercise as told by your health care provider. Exercising is the best way to prevent or manage back pain.  Listen to your body when lifting. If lifting hurts, ask for help or  bend your knees. This uses your leg muscles instead of your back muscles.  Squat down when picking up something from the floor. Do not bend over.  Only use bed rest as told by your health care provider. Bed rest should only be used for the most severe episodes of back pain. Standing, Sitting, and Lying Down  Do not stand in one place for long periods of time.  Use good posture when sitting. Make sure your head rests over your shoulders and is not hanging forward. Use a pillow on your lower back if necessary.  Try sleeping on your side, preferably the left side, with a pillow or two between your legs. If you are sore after a night's rest, your bed may be too soft. A firm mattress may provide more support for your back during pregnancy. General instructions  Do not wear high heels.  Eat a healthy diet. Try to gain weight within your health care provider's recommendations.  Use a maternity girdle, elastic sling, or   back brace as told by your health care provider.  Take over-the-counter and prescription medicines only as told by your health care provider.  Keep all follow-up visits as told by your health care provider. This is important. This includes any visits with any specialists, such as a physical therapist. Contact a health care provider if:  Your back pain interferes with your daily activities.  You have increasing pain in other parts of your body. Get help right away if:  You develop numbness, tingling, weakness, or problems with the use of your arms or legs.  You develop severe back pain that is not controlled with medicine.  You have a sudden change in bowel or bladder control.  You develop shortness of breath, dizziness, or you faint.  You develop nausea, vomiting, or sweating.  You have back pain that is a rhythmic, cramping pain similar to labor pains. Labor pain is usually 1-2 minutes apart, lasts for about 1 minute, and involves a bearing down feeling or pressure in  your pelvis.  You have back pain and your water breaks or you have vaginal bleeding.  You have back pain or numbness that travels down your leg.  Your back pain developed after you fell.  You develop pain on one side of your back.  You see blood in your urine.  You develop skin blisters in the area of your back pain. This information is not intended to replace advice given to you by your health care provider. Make sure you discuss any questions you have with your health care provider. Document Released: 07/09/2005 Document Revised: 09/06/2015 Document Reviewed: 12/13/2014 Elsevier Interactive Patient Education  2018 [employer]. Abdominal Pain During Pregnancy Belly (abdominal) pain is common during pregnancy. Most of the time, it is not a serious problem. Other times, it can be a sign that something is wrong with the pregnancy. Always tell your doctor if you have belly pain. Follow these instructions at home: Monitor your belly pain for any changes. The following actions may help you feel better:  Do not have sex (intercourse) or put anything in your vagina until you feel better.  Rest until your pain stops.  Drink clear fluids if you feel sick to your stomach (nauseous). Do not eat solid food until you feel better.  Only take medicine as told by your doctor.  Keep all doctor visits as told.  Get help right away if:  You are bleeding, leaking fluid, or pieces of tissue come out of your vagina.  You have more pain or cramping.  You keep throwing up (vomiting).  You have pain when you pee (urinate) or have blood in your pee.  You have a fever.  You do not feel your baby moving as much.  You feel very weak or feel like passing out.  You have trouble breathing, with or without belly pain.  You have a very bad headache and belly pain.  You have fluid leaking from your vagina and belly pain.  You keep having watery poop (diarrhea).  Your belly pain does not go away  after resting, or the pain gets worse. This information is not intended to replace advice given to you by your health care provider. Make sure you discuss any questions you have with your health care provider. Document Released: 03/19/2009 Document Revised: 11/07/2015 Document Reviewed: 10/28/2012 Elsevier Interactive Patient Education  2018 [employer]. Round Ligament Pain The round ligament is a cord of muscle and tissue that helps to support the uterus.  It can become a source of pain during pregnancy if it becomes stretched or twisted as the baby grows. The pain usually begins in the second trimester of pregnancy, and it can come and go until the baby is delivered. It is not a serious problem, and it does not cause harm to the baby. Round ligament pain is usually a short, sharp, and pinching pain, but it can also be a dull, lingering, and aching pain. The pain is felt in the lower side of the abdomen or in the groin. It usually starts deep in the groin and moves up to the outside of the hip area. Pain can occur with:  A sudden change in position.  Rolling over in bed.  Coughing or sneezing.  Physical activity.  Follow these instructions at home: Watch your condition for any changes. Take these steps to help with your pain:  When the pain starts, relax. Then try: ? Sitting down. ? Flexing your knees up to your abdomen. ? Lying on your side with one pillow under your abdomen and another pillow between your legs. ? Sitting in a warm bath for 15-20 minutes or until the pain goes away.  Take over-the-counter and prescription medicines only as told by your health care provider.  Move slowly when you sit and stand.  Avoid long walks if they cause pain.  Stop or lessen your physical activities if they cause pain.  Contact a health care provider if:  Your pain does not go away with treatment.  You feel pain in your back that you did not have before.  Your medicine is not  helping. Get help right away if:  You develop a fever or chills.  You develop uterine contractions.  You develop vaginal bleeding.  You develop nausea or vomiting.  You develop diarrhea.  You have pain when you urinate. This information is not intended to replace advice given to you by your health care provider. Make sure you discuss any questions you have with your health care provider. Document Released: 01/08/2008 Document Revised: 09/06/2015 Document Reviewed: 06/07/2014 Elsevier Interactive Patient Education  [employer].

## 2017-10-23 LAB — HEPATITIS B SURFACE ANTIGEN: Hepatitis B Surface Ag: NEGATIVE

## 2017-10-23 LAB — URINALYSIS, ROUTINE W REFLEX MICROSCOPIC
Bilirubin, UA: NEGATIVE
Glucose, UA: NEGATIVE
Ketones, UA: NEGATIVE
Leukocytes, UA: NEGATIVE
Nitrite, UA: NEGATIVE
Protein, UA: NEGATIVE
RBC, UA: NEGATIVE
Specific Gravity, UA: 1.018 (ref 1.005–1.030)
Urobilinogen, Ur: 0.2 mg/dL (ref 0.2–1.0)
pH, UA: 7 (ref 5.0–7.5)

## 2017-10-23 LAB — CBC WITH DIFFERENTIAL
Basophils Absolute: 0 10*3/uL (ref 0.0–0.2)
Basos: 0 %
EOS (ABSOLUTE): 0.2 10*3/uL (ref 0.0–0.4)
Eos: 2 %
Hematocrit: 37.6 % (ref 34.0–46.6)
Hemoglobin: 12.2 g/dL (ref 11.1–15.9)
Immature Grans (Abs): 0 10*3/uL (ref 0.0–0.1)
Immature Granulocytes: 0 %
Lymphocytes Absolute: 2.5 10*3/uL (ref 0.7–3.1)
Lymphs: 25 %
MCH: 29.5 pg (ref 26.6–33.0)
MCHC: 32.4 g/dL (ref 31.5–35.7)
MCV: 91 fL (ref 79–97)
Monocytes Absolute: 0.9 10*3/uL (ref 0.1–0.9)
Monocytes: 9 %
Neutrophils Absolute: 6.4 10*3/uL (ref 1.4–7.0)
Neutrophils: 64 %
RBC: 4.13 x10E6/uL (ref 3.77–5.28)
RDW: 13.1 % (ref 12.3–15.4)
WBC: 10 10*3/uL (ref 3.4–10.8)

## 2017-10-23 LAB — RPR: RPR Ser Ql: NONREACTIVE

## 2017-10-23 LAB — ABO AND RH: Rh Factor: POSITIVE

## 2017-10-23 LAB — VARICELLA ZOSTER ANTIBODY, IGG: Varicella zoster IgG: 2055 index (ref >165)

## 2017-10-23 LAB — ANTIBODY SCREEN: Antibody Screen: NEGATIVE

## 2017-10-23 LAB — HIV ANTIBODY (ROUTINE TESTING W REFLEX): HIV Screen 4th Generation wRfx: NONREACTIVE

## 2017-10-23 LAB — RUBELLA SCREEN: Rubella Antibodies, IGG: 1.45 index (ref >0.99)

## 2017-10-24 LAB — MONITOR DRUG PROFILE 14(MW)
Amphetamine Scrn, Ur: NEGATIVE ng/mL
BARBITURATE SCREEN URINE: NEGATIVE ng/mL
BENZODIAZEPINE SCREEN, URINE: NEGATIVE ng/mL
Buprenorphine, Urine: NEGATIVE ng/mL
CANNABINOIDS UR QL SCN: NEGATIVE ng/mL
Cocaine (Metab) Scrn, Ur: NEGATIVE ng/mL
Creatinine(Crt), U: 75.1 mg/dL (ref 20.0–300.0)
Fentanyl, Urine: NEGATIVE pg/mL
Meperidine Screen, Urine: NEGATIVE ng/mL
Methadone Screen, Urine: NEGATIVE ng/mL
OXYCODONE+OXYMORPHONE UR QL SCN: NEGATIVE ng/mL
Opiate Scrn, Ur: NEGATIVE ng/mL
Ph of Urine: 7 (ref 4.5–8.9)
Phencyclidine Qn, Ur: NEGATIVE ng/mL
Propoxyphene Scrn, Ur: NEGATIVE ng/mL
SPECIFIC GRAVITY: 1.015
Tramadol Screen, Urine: NEGATIVE ng/mL

## 2017-10-24 LAB — URINE CULTURE

## 2017-10-24 LAB — GC/CHLAMYDIA PROBE AMP
Chlamydia trachomatis, NAA: NEGATIVE
Neisseria gonorrhoeae by PCR: NEGATIVE

## 2017-10-24 LAB — NICOTINE SCREEN, URINE: Cotinine Ql Scrn, Ur: NEGATIVE ng/mL

## 2017-11-10 ENCOUNTER — Encounter: Payer: Self-pay | Admitting: Certified Nurse Midwife

## 2017-11-18 ENCOUNTER — Other Ambulatory Visit (INDEPENDENT_AMBULATORY_CARE_PROVIDER_SITE_OTHER): Payer: BLUE CROSS/BLUE SHIELD

## 2017-11-18 ENCOUNTER — Ambulatory Visit (INDEPENDENT_AMBULATORY_CARE_PROVIDER_SITE_OTHER): Payer: BLUE CROSS/BLUE SHIELD | Admitting: Certified Nurse Midwife

## 2017-11-18 ENCOUNTER — Encounter: Payer: Self-pay | Admitting: Certified Nurse Midwife

## 2017-11-18 ENCOUNTER — Other Ambulatory Visit: Payer: Self-pay | Admitting: Certified Nurse Midwife

## 2017-11-18 VITALS — BP 107/67 | HR 85 | Wt 164.6 lb

## 2017-11-18 DIAGNOSIS — Z363 Encounter for antenatal screening for malformations: Secondary | ICD-10-CM | POA: Diagnosis not present

## 2017-11-18 DIAGNOSIS — Z3689 Encounter for other specified antenatal screening: Secondary | ICD-10-CM

## 2017-11-18 DIAGNOSIS — Z3402 Encounter for supervision of normal first pregnancy, second trimester: Secondary | ICD-10-CM

## 2017-11-18 NOTE — Progress Notes (Signed)
Pt is here for an anatomy scan and ROB.

## 2017-11-18 NOTE — Progress Notes (Signed)
   ULTRASOUND REPORT  Location: [facility] Date of Service:  11/18/2017 ROB doing well. No complaints. Anatomy scan today ( see below). Reviewed round ligament pain. Follow up 4 wks.   Doreene BurkeAnnie Akshay Spang, CNM  Indications: Anatomy Findings:  Singleton intrauterine pregnancy is visualized with FHR at 143 BPM. Biometrics give an (U/S) Gestational age of 25 3/7 weeks and an (U/S) EDD of 04/04/18; this correlates with the clinically established EDD of 04/03/18.  Fetal presentation is breech.  EFW: 360 grams (0lb 13oz). Placenta: Posterior and grade 1. AFI: WNL subjectively.  Anatomic survey is complete and appears WNL; Gender - Female.   Right Ovary measures 2.4 x 2.1 x 1.3 cm. It is normal in appearance. Left Ovary was not visualized. There is no obvious evidence of a corpus luteal cyst. Survey of the adnexa demonstrates no adnexal masses. There is no free peritoneal fluid in the cul de sac.  Impression: 1. 20 3/7 week Viable Singleton Intrauterine pregnancy by U/S. 2. (U/S) EDD is consistent with Clinically established (LMP) EDD of 04/03/18. 3. Normal Anatomy Scan  Recommendations: 1.Clinical correlation with the patient's History and Physical Exam.   Kari BaarsJill Long, RDMS

## 2017-11-18 NOTE — Patient Instructions (Signed)

## 2017-12-16 ENCOUNTER — Ambulatory Visit (INDEPENDENT_AMBULATORY_CARE_PROVIDER_SITE_OTHER): Payer: BLUE CROSS/BLUE SHIELD | Admitting: Obstetrics and Gynecology

## 2017-12-16 VITALS — BP 86/53 | HR 78 | Wt 175.3 lb

## 2017-12-16 DIAGNOSIS — Z3492 Encounter for supervision of normal pregnancy, unspecified, second trimester: Secondary | ICD-10-CM | POA: Diagnosis not present

## 2017-12-16 LAB — POCT URINALYSIS DIPSTICK OB
Bilirubin, UA: NEGATIVE
Blood, UA: NEGATIVE
Glucose, UA: NEGATIVE
Ketones, UA: NEGATIVE
Leukocytes, UA: NEGATIVE
Nitrite, UA: NEGATIVE
POC,PROTEIN,UA: NEGATIVE
Spec Grav, UA: 1.01 (ref 1.010–1.025)
Urobilinogen, UA: 0.2 E.U./dL
pH, UA: 6 (ref 5.0–8.0)

## 2017-12-16 NOTE — Patient Instructions (Signed)
Eating Plan for Pregnant Women While you are pregnant, your body will require additional nutrition to help support your growing baby. It is recommended that you consume:  150 additional calories each day during your first trimester.  300 additional calories each day during your second trimester.  300 additional calories each day during your third trimester.  Eating a healthy, well-balanced diet is very important for your health and for your baby's health. You also have a higher need for some vitamins and minerals, such as folic acid, calcium, iron, and vitamin D. What do I need to know about eating during pregnancy?  Do not try to lose weight or go on a diet during pregnancy.  Choose healthy, nutritious foods. Choose  of a sandwich with a glass of milk instead of a candy bar or a high-calorie sugar-sweetened beverage.  Limit your overall intake of foods that have "empty calories." These are foods that have little nutritional value, such as sweets, desserts, candies, sugar-sweetened beverages, and fried foods.  Eat a variety of foods, especially fruits and vegetables.  Take a prenatal vitamin to help meet the additional needs during pregnancy, specifically for folic acid, iron, calcium, and vitamin D.  Remember to stay active. Ask your health care provider for exercise recommendations that are specific to you.  Practice good food safety and cleanliness, such as washing your hands before you eat and after you prepare raw meat. This helps to prevent foodborne illnesses, such as listeriosis, that can be very dangerous for your baby. Ask your health care provider for more information about listeriosis. What does 150 extra calories look like? Healthy options for an additional 150 calories each day could be any of the following:  Plain low-fat yogurt (6-8 oz) with  cup of berries.  1 apple with 2 teaspoons of peanut butter.  Cut-up vegetables with  cup of hummus.  Low-fat chocolate milk  (8 oz or 1 cup).  1 string cheese with 1 medium orange.   of a peanut butter and jelly sandwich on whole-wheat bread (1 tsp of peanut butter).  For 300 calories, you could eat two of those healthy options each day. What is a healthy amount of weight to gain? The recommended amount of weight for you to gain is based on your pre-pregnancy BMI. If your pre-pregnancy BMI was:  Less than 18 (underweight), you should gain 28-40 lb.  18-24.9 (normal), you should gain 25-35 lb.  25-29.9 (overweight), you should gain 15-25 lb.  Greater than 30 (obese), you should gain 11-20 lb.  What if I am having twins or multiples? Generally, pregnant women who will be having twins or multiples may need to increase their daily calories by 300-600 calories each day. The recommended range for total weight gain is 25-54 lb, depending on your pre-pregnancy BMI. Talk with your health care provider for specific guidance about additional nutritional needs, weight gain, and exercise during your pregnancy. What foods can I eat? Grains Any grains. Try to choose whole grains, such as whole-wheat bread, oatmeal, or brown rice. Vegetables Any vegetables. Try to eat a variety of colors and types of vegetables to get a full range of vitamins and minerals. Remember to wash your vegetables well before eating. Fruits Any fruits. Try to eat a variety of colors and types of fruit to get a full range of vitamins and minerals. Remember to wash your fruits well before eating. Meats and Other Protein Sources Lean meats, including chicken, [place], fish, and lean cuts of beef, veal,  or pork. Make sure that all meats are cooked to "well done." Tofu. Tempeh. Beans. Eggs. Peanut butter and other nut butters. Seafood, such as shrimp, crab, and lobster. If you choose fish, select types that are higher in omega-3 fatty acids, including salmon, herring, mussels, trout, sardines, and pollock. Make sure that all meats are cooked to food-safe  temperatures. Dairy Pasteurized milk and milk alternatives. Pasteurized yogurt and pasteurized cheese. Cottage cheese. Sour cream. Beverages Water. Juices that contain 100% fruit juice or vegetable juice. Caffeine-free teas and decaffeinated coffee. Drinks that contain caffeine are okay to drink, but it is better to avoid caffeine. Keep your total caffeine intake to less than 200 mg each day (12 oz of coffee, tea, or soda) or as directed by your health care provider. Condiments Any pasteurized condiments. Sweets and Desserts Any sweets and desserts. Fats and Oils Any fats and oils. The items listed above may not be a complete list of recommended foods or beverages. Contact your dietitian for more options. What foods are not recommended? Vegetables Unpasteurized (raw) vegetable juices. Fruits Unpasteurized (raw) fruit juices. Meats and Other Protein Sources Cured meats that have nitrates, such as bacon, salami, and hotdogs. Luncheon meats, bologna, or other deli meats (unless they are reheated until they are steaming hot). Refrigerated pate, meat spreads from a meat counter, smoked seafood that is found in the refrigerated section of a store. Raw fish, such as sushi or sashimi. High mercury content fish, such as tilefish, shark, swordfish, and king mackerel. Raw meats, such as tuna or beef tartare. Undercooked meats and poultry. Make sure that all meats are cooked to food-safe temperatures. Dairy Unpasteurized (raw) milk and any foods that have raw milk in them. Soft cheeses, such as feta, queso blanco, queso fresco, Brie, Camembert cheeses, blue-veined cheeses, and Panela cheese (unless it is made with pasteurized milk, which must be stated on the label). Beverages Alcohol. Sugar-sweetened beverages, such as sodas, teas, or energy drinks. Condiments Homemade fermented foods and drinks, such as pickles, sauerkraut, or kombucha drinks. (Store-bought pasteurized versions of these are  okay.) Other Salads that are made in the store, such as ham salad, chicken salad, egg salad, tuna salad, and seafood salad. The items listed above may not be a complete list of foods and beverages to avoid. Contact your dietitian for more information. This information is not intended to replace advice given to you by your health care provider. Make sure you discuss any questions you have with your health care provider. Document Released: 01/13/2014 Document Revised: 09/06/2015 Document Reviewed: 09/13/2013 Elsevier Interactive Patient Education  2018 [employer]. Preventing Unhealthy [employer], Adult Staying at a healthy weight is important. When fat builds up in your body, you may become overweight or obese. These conditions put you at greater risk for developing certain health problems, such as heart disease, diabetes, sleeping problems, joint problems, and some cancers. Unhealthy weight gain is often the result of making unhealthy choices in what you eat. It is also a result of not getting enough exercise. You can make changes to your lifestyle to prevent obesity and stay as healthy as possible. What nutrition changes can be made? To maintain a healthy weight and prevent obesity:  Eat only as much as your body needs. To do this: ? Pay attention to signs that you are hungry or full. Stop eating as soon as you feel full. ? If you feel hungry, try drinking water first. Drink enough water so your urine is clear or pale  yellow. ? Eat smaller portions. ? Look at serving sizes on food labels. Most foods contain more than one serving per container. ? Eat the recommended amount of calories for your gender and activity level. While most active people should eat around 2,000 calories per day, if you are trying to lose weight or are not very active, you main need to eat less calories. Talk to your health care provider or dietitian about how many calories you should eat each day.  Choose healthy foods,  such as: ? Fruits and vegetables. Try to fill at least half of your plate at each meal with fruits and vegetables. ? Whole grains, such as whole wheat bread, brown rice, and quinoa. ? Lean meats, such as chicken or fish. ? Other healthy proteins, such as beans, eggs, or tofu. ? Healthy fats, such as nuts, seeds, fatty fish, and olive oil. ? Low-fat or fat-free dairy.  Check food labels and avoid food and drinks that: ? Are high in calories. ? Have added sugar. ? Are high in sodium. ? Have saturated fats or trans fats.  Limit how much you eat of the following foods: ? Prepackaged meals. ? Fast food. ? Fried foods. ? Processed meat, such as bacon, sausage, and deli meats. ? Fatty cuts of red meat and poultry with skin.  Cook foods in healthier ways, such as by baking, broiling, or grilling.  When grocery shopping, try to shop around the outside of the store. This helps you buy mostly fresh foods and avoid canned and prepackaged foods.  What lifestyle changes can be made?  Exercise at least 30 minutes 5 or more days each week. Exercising includes brisk walking, yard work, biking, running, swimming, and team sports like basketball and soccer. Ask your health care provider which exercises are safe for you.  Do not use any products that contain nicotine or tobacco, such as cigarettes and e-cigarettes. If you need help quitting, ask your health care provider.  Limit alcohol intake to no more than 1 drink a day for nonpregnant women and 2 drinks a day for men. One drink equals 12 oz of beer, 5 oz of wine, or 1 oz of hard liquor.  Try to get 7-9 hours of sleep each night. What other changes can be made?  Keep a food and activity journal to keep track of: ? What you ate and how many calories you had. Remember to count sauces, dressings, and side dishes. ? Whether you were active, and what exercises you did. ? Your calorie, weight, and activity goals.  Check your weight regularly.  Track any changes. If you notice you have gained weight, make changes to your diet or activity routine.  Avoid taking weight-loss medicines or supplements. Talk to your health care provider before starting any new medicine or supplement.  Talk to your health care provider before trying any new diet or exercise plan. Why are these changes important? Eating healthy, staying active, and having healthy habits not only help prevent obesity, they also:  Help you to manage stress and emotions.  Help you to connect with friends and family.  Improve your self-esteem.  Improve your sleep.  Prevent long-term health problems.  What can happen if changes are not made? Being obese or overweight can cause you to develop joint or bone problems, which can make it hard for you to stay active or do activities you enjoy. Being obese or overweight also puts stress on your heart and lungs and can lead to  health problems like diabetes, heart disease, and some cancers. Where to find more information: Talk with your health care provider or a dietitian about healthy eating and healthy lifestyle choices. You may also find other information through these resources:  U.[place]. Department of Agriculture MyPlate: https://ball-collins.biz/  American Heart Association: www.heart.org  Centers for Disease Control and Prevention: FootballExhibition.com.br  Summary  Staying at a healthy weight is important. It helps prevent certain diseases and health problems, such as heart disease, diabetes, joint problems, sleep disorders, and some cancers.  Being obese or overweight can cause you to develop joint or bone problems, which can make it hard for you to stay active or do activities you enjoy.  You can prevent unhealthy weight gain by eating a healthy diet, exercising regularly, not smoking, limiting alcohol, and getting enough sleep.  Talk with your health care provider or a dietitian for guidance about healthy eating and healthy lifestyle  choices. This information is not intended to replace advice given to you by your health care provider. Make sure you discuss any questions you have with your health care provider. Document Released: 04/01/2016 Document Revised: 05/07/2016 Document Reviewed: 05/07/2016 Elsevier Interactive Patient Education  [employer].

## 2017-12-16 NOTE — Progress Notes (Signed)
ROB- doing well, discussed weight gain in pregnancy. glucoal next visit. Already signed up for classes.

## 2017-12-16 NOTE — Progress Notes (Signed)
ROB- pt is concerned about her weight, otherwise she is doing well

## 2017-12-23 ENCOUNTER — Other Ambulatory Visit: Payer: Self-pay

## 2017-12-23 ENCOUNTER — Ambulatory Visit (INDEPENDENT_AMBULATORY_CARE_PROVIDER_SITE_OTHER)
Admission: EM | Admit: 2017-12-23 | Discharge: 2017-12-23 | Disposition: A | Discharge status: Home / Self Care | Payer: BLUE CROSS/BLUE SHIELD | Source: Home / Self Care | Attending: Family Medicine | Admitting: Family Medicine

## 2017-12-23 ENCOUNTER — Emergency Department
Admission: EM | Admit: 2017-12-23 | Discharge: 2017-12-23 | Disposition: A | Discharge status: Home / Self Care | Payer: BLUE CROSS/BLUE SHIELD | Attending: Emergency Medicine | Admitting: Emergency Medicine

## 2017-12-23 ENCOUNTER — Encounter: Payer: Self-pay | Admitting: Emergency Medicine

## 2017-12-23 ENCOUNTER — Emergency Department: Payer: BLUE CROSS/BLUE SHIELD

## 2017-12-23 ENCOUNTER — Telehealth: Payer: Self-pay | Admitting: Certified Nurse Midwife

## 2017-12-23 ENCOUNTER — Telehealth: Payer: Self-pay

## 2017-12-23 DIAGNOSIS — R0789 Other chest pain: Secondary | ICD-10-CM | POA: Diagnosis not present

## 2017-12-23 DIAGNOSIS — R0602 Shortness of breath: Secondary | ICD-10-CM

## 2017-12-23 DIAGNOSIS — Z3A26 26 weeks gestation of pregnancy: Secondary | ICD-10-CM

## 2017-12-23 DIAGNOSIS — R079 Chest pain, unspecified: Secondary | ICD-10-CM | POA: Diagnosis not present

## 2017-12-23 DIAGNOSIS — Z331 Pregnant state, incidental: Secondary | ICD-10-CM

## 2017-12-23 LAB — COMPREHENSIVE METABOLIC PANEL
ALT: 23 U/L (ref 0–44)
AST: 16 U/L (ref 15–41)
Albumin: 3.4 g/dL — ABNORMAL LOW (ref 3.5–5.0)
Alkaline Phosphatase: 38 U/L (ref 38–126)
Anion gap: 7 (ref 5–15)
BUN: 14 mg/dL (ref 6–20)
CO2: 21 mmol/L — ABNORMAL LOW (ref 22–32)
Calcium: 9 mg/dL (ref 8.9–10.3)
Chloride: 108 mmol/L (ref 98–111)
Creatinine, Ser: 0.48 mg/dL (ref 0.44–1.00)
GFR calc Af Amer: >60 mL/min (ref >60)
GFR calc non Af Amer: >60 mL/min (ref >60)
Glucose, Bld: 80 mg/dL (ref 70–99)
Potassium: 3.9 mmol/L (ref 3.5–5.1)
Sodium: 136 mmol/L (ref 135–145)
Total Bilirubin: 0.3 mg/dL (ref 0.3–1.2)
Total Protein: 6.5 g/dL (ref 6.5–8.1)

## 2017-12-23 LAB — CBC
HCT: 33.8 % — ABNORMAL LOW (ref 35.0–47.0)
Hemoglobin: 11.9 g/dL — ABNORMAL LOW (ref 12.0–16.0)
MCH: 32.2 pg (ref 26.0–34.0)
MCHC: 35.3 g/dL (ref 32.0–36.0)
MCV: 91.4 fL (ref 80.0–100.0)
Platelets: 235 10*3/uL (ref 150–440)
RBC: 3.7 MIL/uL — ABNORMAL LOW (ref 3.80–5.20)
RDW: 13.6 % (ref 11.5–14.5)
WBC: 10.7 10*3/uL (ref 3.6–11.0)

## 2017-12-23 LAB — TROPONIN I: Troponin I: <0.03 ng/mL (ref <0.03)

## 2017-12-23 MED ORDER — CYCLOBENZAPRINE HCL 10 MG PO TABS: 10.0000 mg | ORAL_TABLET | Freq: Three times a day (TID) | ORAL | 0 refills | Status: DC | PRN

## 2017-12-23 NOTE — ED Notes (Signed)
First Nurse Note: Patient states she sneezed this AM and now has chest pain behind sternum since that time.  Seen at [facility] with EKG done.

## 2017-12-23 NOTE — ED Notes (Signed)
Spoke with pt about wait times and what to expect next. Advised pt that I am available for further questions if needed.  

## 2017-12-23 NOTE — ED Provider Notes (Signed)
[facility] Emergency Department Provider Note       Time seen: ----------------------------------------- 1:08 PM on 12/23/2017 -----------------------------------------   I have reviewed the triage vital signs and the nursing notes.  HISTORY   Chief Complaint Chest Pain and Shortness of Breath   HPI Cassandra Stephens is a 27 y.o. female with a history of anxiety, depression, UTI who presents to the ED for chest pain shortness of breath that started this morning.  Patient reports she sneezed and the chest pain started after that.  Pain radiated into her back.  She reports she is 6 months pregnant.  She has not had any vaginal bleeding, abdominal pain or leakage of fluid.  Past Medical History:  Diagnosis Date  . Anxiety   . Depression   . Herpes simplex virus (HSV) type I or type II DNA not detected by PCR     Patient Active Problem List   Diagnosis Date Noted  . Urinary tract infection with hematuria 06/29/2017  . Dysuria 06/29/2017  . Pharyngitis 06/29/2017    Past Surgical History:  Procedure Laterality Date  . TONSILECTOMY, ADENOIDECTOMY, BILATERAL MYRINGOTOMY AND TUBES    . TONSILLECTOMY Bilateral     Allergies Patient has no known allergies.  Social History Social History   Tobacco Use  . Smoking status: Never Smoker  . Smokeless tobacco: Never Used  Substance Use Topics  . Alcohol use: Never    Frequency: Never  . Drug use: Never   Review of Systems Constitutional: Negative for fever. Cardiovascular: Positive for chest pain Respiratory: Positive for shortness of breath Gastrointestinal: Negative for abdominal pain, vomiting and diarrhea. Musculoskeletal: Negative for back pain. Skin: Negative for rash. Neurological: Negative for headaches, focal weakness or numbness.  All systems negative/normal/unremarkable except as stated in the HPI  ____________________________________________   PHYSICAL EXAM:  VITAL SIGNS: ED  Triage Vitals  Enc Vitals Group     BP 12/23/17 1107 109/69     Pulse Rate 12/23/17 1107 73     Resp 12/23/17 1107 14     Temp 12/23/17 1107 97.8 F (36.6 C)     Temp Source 12/23/17 1107 Oral     SpO2 12/23/17 1107 98 %     Weight 12/23/17 1104 174 lb (78.9 kg)     Height 12/23/17 1104 5\' 4"  (1.626 m)     Head Circumference --      Peak Flow --      Pain Score 12/23/17 1104 6     Pain Loc --      Pain Edu? --      Excl. in GC? --    Constitutional: Alert and oriented. Well appearing and in no distress. Eyes: Conjunctivae are normal. Normal extraocular movements. ENT   Head: Normocephalic and atraumatic.   Nose: No congestion/rhinnorhea.   Mouth/Throat: Mucous membranes are moist.   Neck: No stridor. Cardiovascular: Normal rate, regular rhythm. No murmurs, rubs, or gallops. Respiratory: Normal respiratory effort without tachypnea nor retractions. Breath sounds are clear and equal bilaterally. No wheezes/rales/rhonchi. Gastrointestinal: Soft and nontender. Normal bowel sounds Musculoskeletal: Nontender with normal range of motion in extremities. No lower extremity tenderness nor edema. Neurologic:  Normal speech and language. No gross focal neurologic deficits are appreciated.  Skin:  Skin is warm, dry and intact. No rash noted. Psychiatric: Mood and affect are normal. Speech and behavior are normal.  ____________________________________________  EKG: Interpreted by me.  Sinus rhythm rate 85 bpm, normal PR interval, normal QRS, normal QT  ____________________________________________  ED COURSE:  As part of my medical decision making, I reviewed the following data within the electronic MEDICAL RECORD NUMBER History obtained from family if available, nursing notes, old chart and ekg, as well as notes from prior ED visits. Patient presented for chest pain which seems noncardiac and not related to PE, we will assess with labs and imaging as indicated at this time.    Procedures ____________________________________________   LABS (pertinent positives/negatives)  Labs Reviewed  CBC - Abnormal; Notable for the following components:      Result Value   RBC 3.70 (*)    Hemoglobin 11.9 (*)    HCT 33.8 (*)    All other components within normal limits  COMPREHENSIVE METABOLIC PANEL - Abnormal; Notable for the following components:   CO2 21 (*)    Albumin 3.4 (*)    All other components within normal limits  TROPONIN I  POC URINE PREG, ED    RADIOLOGY  Chest x-ray is normal  ____________________________________________  DIFFERENTIAL DIAGNOSIS   Musculoskeletal pain, spasm, pneumothorax, PE unlikely  FINAL ASSESSMENT AND PLAN  Chest pain   Plan: The patient had presented for musculoskeletal pain which is likely secondary to muscle spasm. Patient's labs are unremarkable. Patient's imaging is also unremarkable.  Wells criteria is low risk for PE.  I will advise Tylenol and prescribe Flexeril as needed.  I do not think she needs further work-up for PE at this time.   Ulice Dash, MD   Note: This note was generated in part or whole with voice recognition software. Voice recognition is usually quite accurate but there are transcription errors that can and very often do occur. I apologize for any typographical errors that were not detected and corrected.     Emily Filbert, MD 12/23/17 [phone]

## 2017-12-23 NOTE — Discharge Instructions (Signed)
Recommend patient go to Emergency Department for further evaluation and management °

## 2017-12-23 NOTE — ED Triage Notes (Signed)
Patient stated she was driving to work and she started having chest tightness about 1 hour ago. Patient states she is 25-[redacted] weeks pregnant.

## 2017-12-23 NOTE — ED Notes (Signed)
ED Provider at bedside. 

## 2017-12-23 NOTE — ED Notes (Signed)
EKG performed and read by Dr. Wynema Birch.

## 2017-12-23 NOTE — Telephone Encounter (Signed)
Pt reported to ED.

## 2017-12-23 NOTE — ED Triage Notes (Addendum)
Pt c/o chest pain and shortness of breath that started this am - she reports that she coughed/sneezed and the chest pain started - she states that it radiates through into back - pt is 6 months pregnant

## 2017-12-23 NOTE — Telephone Encounter (Signed)
The patient sneezed on the way to work and states that she had a tightness in her chest and it has been persistent since the sneeze about 30 mins ago, 7 AM.  She is anxious and nervous and is asking for a nurse to call her back.  She is 25 weeks.  Please advise, thanks.

## 2017-12-23 NOTE — ED Provider Notes (Signed)
[facility]    CSN: [id number] Arrival date & time: 12/23/17  7829     History   Chief Complaint Chief Complaint  Patient presents with  . Chest Pain    HPI Cassandra Stephens is a 27 y.o. female.   27 yo female who is [redacted] weeks gestation of pregnancy presents with a c/o sudden onset of "chest pressure", "chest tightness" and shortness of breath that started suddenly this morning while she was driving to work. States pressure feels worse when sitting up. Denies any fevers, chills, cough, injuries, recent travel, prolonged immobilization, vaginal bleeding. States pregnancy has been uneventful and she is generally healthy.   The history is provided by the patient.  Chest Pain    Past Medical History:  Diagnosis Date  . Anxiety   . Depression   . Herpes simplex virus (HSV) type I or type II DNA not detected by PCR     Patient Active Problem List   Diagnosis Date Noted  . Urinary tract infection with hematuria 06/29/2017  . Dysuria 06/29/2017  . Pharyngitis 06/29/2017    Past Surgical History:  Procedure Laterality Date  . TONSILECTOMY, ADENOIDECTOMY, BILATERAL MYRINGOTOMY AND TUBES    . TONSILLECTOMY Bilateral     OB History    Gravida  1   Para  0   Term  0   Preterm  0   AB  0   Living  0     SAB  0   TAB  0   Ectopic  0   Multiple  0   Live Births  0            Home Medications    Prior to Admission medications   Medication Sig Start Date End Date Taking? Authorizing Provider  Prenatal Vit-Fe Fumarate-FA (PRENATAL MULTIVITAMIN) TABS tablet Take 1 tablet by mouth daily at 12 noon.   Yes [provider]    Family History Family History  Problem Relation Age of Onset  . Hyperlipidemia Father   . Hypertension Father   . Heart attack Father   . Diabetes Maternal Grandfather   . Diabetes Paternal Grandfather   . Heart disease Paternal Grandfather     Social History Social History   Tobacco Use  . Smoking  status: Never Smoker  . Smokeless tobacco: Never Used  Substance Use Topics  . Alcohol use: Never    Frequency: Never  . Drug use: Never     Allergies   Patient has no known allergies.   Review of Systems Review of Systems  Cardiovascular: Positive for chest pain.     Physical Exam Triage Vital Signs ED Triage Vitals  Enc Vitals Group     BP 12/23/17 0849 107/69     Pulse Rate 12/23/17 0849 91     Resp 12/23/17 0849 18     Temp 12/23/17 0849 98.2 F (36.8 C)     Temp Source 12/23/17 0849 Oral     SpO2 12/23/17 0849 99 %     Weight 12/23/17 0848 174 lb (78.9 kg)     Height 12/23/17 0848 5\' 4"  (1.626 m)     Head Circumference --      Peak Flow --      Pain Score 12/23/17 0847 5     Pain Loc --      Pain Edu? --      Excl. in GC? --    No data found.  Updated Vital Signs BP 107/69 (  BP Location: Left Arm)   Pulse 91   Temp 98.2 F (36.8 C) (Oral)   Resp 18   Ht 5\' 4"  (1.626 m)   Wt 78.9 kg   LMP  (LMP Unknown)   SpO2 99%   BMI 29.87 kg/m   Visual Acuity Right Eye Distance:   Left Eye Distance:   Bilateral Distance:    Right Eye Near:   Left Eye Near:    Bilateral Near:     Physical Exam  Constitutional: She appears well-developed and well-nourished. No distress.  Cardiovascular: Normal rate, regular rhythm and normal heart sounds.  Pulmonary/Chest: Effort normal and breath sounds normal. No stridor. No respiratory distress. She has no wheezes. She has no rales. She exhibits no tenderness.  Abdominal: Soft. There is no tenderness.  Skin: She is not diaphoretic.  Nursing note and vitals reviewed.    UC Treatments / Results  Labs (all labs ordered are listed, but only abnormal results are displayed) Labs Reviewed - No data to display  EKG None  Radiology No results found.  Procedures Procedures (including critical care time)  Medications Ordered in UC Medications - No data to display  Initial Impression / Assessment and Plan / UC  Course  I have reviewed the triage vital signs and the nursing notes.  Pertinent labs & imaging results that were available during my care of the patient were reviewed by me and considered in my medical decision making (see chart for details).      Final Clinical Impressions(s) / UC Diagnoses   Final diagnoses:  Chest tightness or pressure  Shortness of breath  [redacted] weeks gestation of pregnancy     Discharge Instructions     Recommend patient go to Emergency Department for further evaluation and management    ED Prescriptions    None      1. ekg result (normal/negative) and possible diagnosis reviewed with patient; recommend patient go to Emergency Department for further evaluation and management; patient in stable condition will proceed to ED by private vehicle.   Controlled Substance Prescriptions [place] Controlled Substance Registry consulted? Not Applicable   Payton Mccallum, MD 12/23/17 [phone]

## 2018-01-14 ENCOUNTER — Other Ambulatory Visit: Payer: BLUE CROSS/BLUE SHIELD

## 2018-01-14 ENCOUNTER — Ambulatory Visit (INDEPENDENT_AMBULATORY_CARE_PROVIDER_SITE_OTHER): Payer: BLUE CROSS/BLUE SHIELD | Admitting: Certified Nurse Midwife

## 2018-01-14 VITALS — BP 103/72 | HR 102 | Wt 181.2 lb

## 2018-01-14 DIAGNOSIS — Z3A28 28 weeks gestation of pregnancy: Secondary | ICD-10-CM

## 2018-01-14 DIAGNOSIS — O2603 Excessive weight gain in pregnancy, third trimester: Secondary | ICD-10-CM

## 2018-01-14 DIAGNOSIS — Z23 Encounter for immunization: Secondary | ICD-10-CM | POA: Diagnosis not present

## 2018-01-14 DIAGNOSIS — O2600 Excessive weight gain in pregnancy, unspecified trimester: Secondary | ICD-10-CM

## 2018-01-14 DIAGNOSIS — Z3492 Encounter for supervision of normal pregnancy, unspecified, second trimester: Secondary | ICD-10-CM | POA: Diagnosis not present

## 2018-01-14 LAB — POCT URINALYSIS DIPSTICK OB
Bilirubin, UA: NEGATIVE
Blood, UA: NEGATIVE
Glucose, UA: NEGATIVE
Ketones, UA: NEGATIVE
Leukocytes, UA: NEGATIVE
Nitrite, UA: NEGATIVE
POC,PROTEIN,UA: NEGATIVE
Spec Grav, UA: 1.01 (ref 1.010–1.025)
Urobilinogen, UA: 0.2 E.U./dL
pH, UA: 7 (ref 5.0–8.0)

## 2018-01-14 MED ORDER — TETANUS-DIPHTH-ACELL PERTUSSIS 5-2.5-18.5 LF-MCG/0.5 IM SUSP
0.5000 mL | Freq: Once | INTRAMUSCULAR | Status: AC
  Administered 2018-01-14: 0.5 mL via INTRAMUSCULAR

## 2018-01-14 NOTE — Patient Instructions (Addendum)
Round Ligament Pain The round ligament is a cord of muscle and tissue that helps to support the uterus. It can become a source of pain during pregnancy if it becomes stretched or twisted as the baby grows. The pain usually begins in the second trimester of pregnancy, and it can come and go until the baby is delivered. It is not a serious problem, and it does not cause harm to the baby. Round ligament pain is usually a short, sharp, and pinching pain, but it can also be a dull, lingering, and aching pain. The pain is felt in the lower side of the abdomen or in the groin. It usually starts deep in the groin and moves up to the outside of the hip area. Pain can occur with:  A sudden change in position.  Rolling over in bed.  Coughing or sneezing.  Physical activity.  Follow these instructions at home: Watch your condition for any changes. Take these steps to help with your pain:  When the pain starts, relax. Then try: ? Sitting down. ? Flexing your knees up to your abdomen. ? Lying on your side with one pillow under your abdomen and another pillow between your legs. ? Sitting in a warm bath for 15-20 minutes or until the pain goes away.  Take over-the-counter and prescription medicines only as told by your health care provider.  Move slowly when you sit and stand.  Avoid long walks if they cause pain.  Stop or lessen your physical activities if they cause pain.  Contact a health care provider if:  Your pain does not go away with treatment.  You feel pain in your back that you did not have before.  Your medicine is not helping. Get help right away if:  You develop a fever or chills.  You develop uterine contractions.  You develop vaginal bleeding.  You develop nausea or vomiting.  You develop diarrhea.  You have pain when you urinate. This information is not intended to replace advice given to you by your health care provider. Make sure you discuss any questions you have  with your health care provider. Document Released: 01/08/2008 Document Revised: 09/06/2015 Document Reviewed: 06/07/2014 Elsevier Interactive Patient Education  2018 [employer]. Back Pain in Pregnancy Back pain during pregnancy is common. Back pain may be caused by several factors that are related to changes during your pregnancy. Follow these instructions at home: Managing pain, stiffness, and swelling  If directed, apply ice for sudden (acute) back pain. ? Put ice in a plastic bag. ? Place a towel between your skin and the bag. ? Leave the ice on for 20 minutes, 2-3 times per day.  If directed, apply heat to the affected area before you exercise: ? Place a towel between your skin and the heat pack or heating pad. ? Leave the heat on for 20-30 minutes. ? Remove the heat if your skin turns bright red. This is especially important if you are unable to feel pain, heat, or cold. You may have a greater risk of getting burned. Activity  Exercise as told by your health care provider. Exercising is the best way to prevent or manage back pain.  Listen to your body when lifting. If lifting hurts, ask for help or bend your knees. This uses your leg muscles instead of your back muscles.  Squat down when picking up something from the floor. Do not bend over.  Only use bed rest as told by your health care provider. Bed  rest should only be used for the most severe episodes of back pain. Standing, Sitting, and Lying Down  Do not stand in one place for long periods of time.  Use good posture when sitting. Make sure your head rests over your shoulders and is not hanging forward. Use a pillow on your lower back if necessary.  Try sleeping on your side, preferably the left side, with a pillow or two between your legs. If you are sore after a night's rest, your bed may be too soft. A firm mattress may provide more support for your back during pregnancy. General instructions  Do not wear high  heels.  Eat a healthy diet. Try to gain weight within your health care provider's recommendations.  Use a maternity girdle, elastic sling, or back brace as told by your health care provider.  Take over-the-counter and prescription medicines only as told by your health care provider.  Keep all follow-up visits as told by your health care provider. This is important. This includes any visits with any specialists, such as a physical therapist. Contact a health care provider if:  Your back pain interferes with your daily activities.  You have increasing pain in other parts of your body. Get help right away if:  You develop numbness, tingling, weakness, or problems with the use of your arms or legs.  You develop severe back pain that is not controlled with medicine.  You have a sudden change in bowel or bladder control.  You develop shortness of breath, dizziness, or you faint.  You develop nausea, vomiting, or sweating.  You have back pain that is a rhythmic, cramping pain similar to labor pains. Labor pain is usually 1-2 minutes apart, lasts for about 1 minute, and involves a bearing down feeling or pressure in your pelvis.  You have back pain and your water breaks or you have vaginal bleeding.  You have back pain or numbness that travels down your leg.  Your back pain developed after you fell.  You develop pain on one side of your back.  You see blood in your urine.  You develop skin blisters in the area of your back pain. This information is not intended to replace advice given to you by your health care provider. Make sure you discuss any questions you have with your health care provider. Document Released: 07/09/2005 Document Revised: 09/06/2015 Document Reviewed: 12/13/2014 Elsevier Interactive Patient Education  2018 [employer]. Common Medications Safe in Pregnancy  Acne:      Constipation:  Benzoyl Peroxide     Colace  Clindamycin      Dulcolax  Suppository  Topica Erythromycin     Fibercon  Salicylic Acid      Metamucil         Miralax AVOID:        Senakot   Accutane    Cough:  Retin-A       Cough Drops  Tetracycline      Phenergan w/ Codeine if Rx  Minocycline      Robitussin (Plain & DM)  Antibiotics:     Crabs/Lice:  Ceclor       RID  Cephalosporins    AVOID:  E-Mycins      Kwell  Keflex  Macrobid/Macrodantin   Diarrhea:  Penicillin      Kao-Pectate  Zithromax      Imodium AD         PUSH FLUIDS AVOID:       Cipro  Fever:  Tetracycline      Tylenol (Regular or Extra  Minocycline       Strength)  Levaquin      Extra Strength-Do not          Exceed 8 tabs/24 hrs Caffeine:        <264m/day (equiv. To 1 cup of coffee or  approx. 3 12 oz sodas)         Gas: Cold/Hayfever:       Gas-X  Benadryl      Mylicon  Claritin       Phazyme  **Claritin-D        Chlor-Trimeton    Headaches:  Dimetapp      ASA-Free Excedrin  Drixoral-Non-Drowsy     Cold Compress  Mucinex (Guaifenasin)     Tylenol (Regular or Extra  Sudafed/Sudafed-12 Hour     Strength)  **Sudafed PE Pseudoephedrine   Tylenol Cold & Sinus     Vicks Vapor Rub  Zyrtec  **AVOID if Problems With Blood Pressure         Heartburn: Avoid lying down for at least 1 hour after meals  Aciphex      Maalox     Rash:  Milk of Magnesia     Benadryl    Mylanta       1% Hydrocortisone Cream  Pepcid  Pepcid Complete   Sleep Aids:  Prevacid      Ambien   Prilosec       Benadryl  Rolaids       Chamomile Tea  Tums (Limit 4/day)     Unisom  Zantac       Tylenol PM         Warm milk-add vanilla or  Hemorrhoids:       Sugar for taste  Anusol/Anusol H.C.  (RX: Analapram 2.5%)  Sugar Substitutes:  Hydrocortisone OTC     Ok in moderation  Preparation H      Tucks        Vaseline lotion applied to tissue with wiping    Herpes:     Throat:  Acyclovir      Oragel  Famvir  Valtrex     Vaccines:         Flu Shot Leg  Cramps:       *Gardasil  Benadryl      Hepatitis A         Hepatitis B Nasal Spray:       Pneumovax  Saline Nasal Spray     Polio Booster         Tetanus Nausea:       Tuberculosis test or PPD  Vitamin B6 25 mg TID   AVOID:    Dramamine      *Gardasil  Emetrol       Live Poliovirus  Ginger Root 250 mg QID    MMR (measles, mumps &  High Complex Carbs @ Bedtime    rebella)  Sea Bands-Accupressure    Varicella (Chickenpox)  Unisom 1/2 tab TID     *No known complications           If received before Pain:         Known pregnancy;   Darvocet       Resume series after  Lortab        Delivery  Percocet    Yeast:   Tramadol      Femstat  Tylenol 3      Gyne-lotrimin  Ultram  Monistat  Vicodin           MISC:         All Sunscreens           Hair Coloring/highlights          Insect Repellant's          (Including DEET)         Mystic Tans Third Trimester of Pregnancy The third trimester is from week 29 through week 42, months 7 through 9. This trimester is when your unborn baby (fetus) is growing very fast. At the end of the ninth month, the unborn baby is about 20 inches in length. It weighs about 6-10 pounds. Follow these instructions at home:  Avoid all smoking, herbs, and alcohol. Avoid drugs not approved by your doctor.  Do not use any tobacco products, including cigarettes, chewing tobacco, and electronic cigarettes. If you need help quitting, ask your doctor. You may get counseling or other support to help you quit.  Only take medicine as told by your doctor. Some medicines are safe and some are not during pregnancy.  Exercise only as told by your doctor. Stop exercising if you start having cramps.  Eat regular, healthy meals.  Wear a good support bra if your breasts are tender.  Do not use hot tubs, steam rooms, or saunas.  Wear your seat belt when driving.  Avoid raw meat, uncooked cheese, and liter boxes and soil used by cats.  Take your prenatal  vitamins.  Take 1500-2000 milligrams of calcium daily starting at the 20th week of pregnancy until you deliver your baby.  Try taking medicine that helps you poop (stool softener) as needed, and if your doctor approves. Eat more fiber by eating fresh fruit, vegetables, and whole grains. Drink enough fluids to keep your pee (urine) clear or pale yellow.  Take warm water baths (sitz baths) to soothe pain or discomfort caused by hemorrhoids. Use hemorrhoid cream if your doctor approves.  If you have puffy, bulging veins (varicose veins), wear support hose. Raise (elevate) your feet for 15 minutes, 3-4 times a day. Limit salt in your diet.  Avoid heavy lifting, wear low heels, and sit up straight.  Rest with your legs raised if you have leg cramps or low back pain.  Visit your dentist if you have not gone during your pregnancy. Use a soft toothbrush to brush your teeth. Be gentle when you floss.  You can have sex (intercourse) unless your doctor tells you not to.  Do not travel far distances unless you must. Only do so with your doctor's approval.  Take prenatal classes.  Practice driving to the hospital.  Pack your hospital bag.  Prepare the baby's room.  Go to your doctor visits. Get help if:  You are not sure if you are in labor or if your water has broken.  You are dizzy.  You have mild cramps or pressure in your lower belly (abdominal).  You have a nagging pain in your belly area.  You continue to feel sick to your stomach (nauseous), throw up (vomit), or have watery poop (diarrhea).  You have bad smelling fluid coming from your vagina.  You have pain with peeing (urination). Get help right away if:  You have a fever.  You are leaking fluid from your vagina.  You are spotting or bleeding from your vagina.  You have severe belly cramping or pain.  You lose or gain weight rapidly.  You have trouble catching  your breath and have chest pain.  You notice sudden  or extreme puffiness (swelling) of your face, hands, ankles, feet, or legs.  You have not felt the baby move in over an hour.  You have severe headaches that do not go away with medicine.  You have vision changes. This information is not intended to replace advice given to you by your health care provider. Make sure you discuss any questions you have with your health care provider. Document Released: 06/25/2009 Document Revised: 09/06/2015 Document Reviewed: 06/01/2012 Elsevier Interactive Patient Education  2017 [employer]. Breastfeeding Choosing to breastfeed is one of the best decisions you can make for yourself and your baby. A change in hormones during pregnancy causes your breasts to make breast milk in your milk-producing glands. Hormones prevent breast milk from being released before your baby is born. They also prompt milk flow after birth. Once breastfeeding has begun, thoughts of your baby, as well as his or her sucking or crying, can stimulate the release of milk from your milk-producing glands. Benefits of breastfeeding Research shows that breastfeeding offers many health benefits for infants and mothers. It also offers a cost-free and convenient way to feed your baby. For your baby  Your first milk (colostrum) helps your baby's digestive system to function better.  Special cells in your milk (antibodies) help your baby to fight off infections.  Breastfed babies are less likely to develop asthma, allergies, obesity, or type 2 diabetes. They are also at lower risk for sudden infant death syndrome (SIDS).  Nutrients in breast milk are better able to meet your baby's needs compared to infant formula.  Breast milk improves your baby's brain development. For you  Breastfeeding helps to create a very special bond between you and your baby.  Breastfeeding is convenient. Breast milk costs nothing and is always available at the correct temperature.  Breastfeeding helps to burn  calories. It helps you to lose the weight that you gained during pregnancy.  Breastfeeding makes your uterus return faster to its size before pregnancy. It also slows bleeding (lochia) after you give birth.  Breastfeeding helps to lower your risk of developing type 2 diabetes, osteoporosis, rheumatoid arthritis, cardiovascular disease, and breast, ovarian, uterine, and endometrial cancer later in life. Breastfeeding basics Starting breastfeeding  Find a comfortable place to sit or lie down, with your neck and back well-supported.  Place a pillow or a rolled-up blanket under your baby to bring him or her to the level of your breast (if you are seated). Nursing pillows are specially designed to help support your arms and your baby while you breastfeed.  Make sure that your baby's tummy (abdomen) is facing your abdomen.  Gently massage your breast. With your fingertips, massage from the outer edges of your breast inward toward the nipple. This encourages milk flow. If your milk flows slowly, you may need to continue this action during the feeding.  Support your breast with 4 fingers underneath and your thumb above your nipple (make the letter "C" with your hand). Make sure your fingers are well away from your nipple and your baby's mouth.  Stroke your baby's lips gently with your finger or nipple.  When your baby's mouth is open wide enough, quickly bring your baby to your breast, placing your entire nipple and as much of the areola as possible into your baby's mouth. The areola is the colored area around your nipple. ? More areola should be visible above your baby's upper lip than below  the lower lip. ? Your baby's lips should be opened and extended outward (flanged) to ensure an adequate, comfortable latch. ? Your baby's tongue should be between his or her lower gum and your breast.  Make sure that your baby's mouth is correctly positioned around your nipple (latched). Your baby's lips should  create a seal on your breast and be turned out (everted).  It is common for your baby to suck about 2-3 minutes in order to start the flow of breast milk. Latching Teaching your baby how to latch onto your breast properly is very important. An improper latch can cause nipple pain, decreased milk supply, and poor weight gain in your baby. Also, if your baby is not latched onto your nipple properly, he or she may swallow some air during feeding. This can make your baby fussy. Burping your baby when you switch breasts during the feeding can help to get rid of the air. However, teaching your baby to latch on properly is still the best way to prevent fussiness from swallowing air while breastfeeding. Signs that your baby has successfully latched onto your nipple  Silent tugging or silent sucking, without causing you pain. Infant's lips should be extended outward (flanged).  Swallowing heard between every 3-4 sucks once your milk has started to flow (after your let-down milk reflex occurs).  Muscle movement above and in front of his or her ears while sucking.  Signs that your baby has not successfully latched onto your nipple  Sucking sounds or smacking sounds from your baby while breastfeeding.  Nipple pain.  If you think your baby has not latched on correctly, slip your finger into the corner of your baby's mouth to break the suction and place it between your baby's gums. Attempt to start breastfeeding again. Signs of successful breastfeeding Signs from your baby  Your baby will gradually decrease the number of sucks or will completely stop sucking.  Your baby will fall asleep.  Your baby's body will relax.  Your baby will retain a small amount of milk in his or her mouth.  Your baby will let go of your breast by himself or herself.  Signs from you  Breasts that have increased in firmness, weight, and size 1-3 hours after feeding.  Breasts that are softer immediately after  breastfeeding.  Increased milk volume, as well as a change in milk consistency and color by the fifth day of breastfeeding.  Nipples that are not sore, cracked, or bleeding.  Signs that your baby is getting enough milk  Wetting at least 1-2 diapers during the first 24 hours after birth.  Wetting at least 5-6 diapers every 24 hours for the first week after birth. The urine should be clear or pale yellow by the age of 5 days.  Wetting 6-8 diapers every 24 hours as your baby continues to grow and develop.  At least 3 stools in a 24-hour period by the age of 5 days. The stool should be soft and yellow.  At least 3 stools in a 24-hour period by the age of 7 days. The stool should be seedy and yellow.  No loss of weight greater than 10% of birth weight during the first 3 days of life.  Average weight gain of 4-7 oz (113-198 g) per week after the age of 4 days.  Consistent daily weight gain by the age of 5 days, without weight loss after the age of 2 weeks. After a feeding, your baby may spit up a small  amount of milk. This is normal. Breastfeeding frequency and duration Frequent feeding will help you make more milk and can prevent sore nipples and extremely full breasts (breast engorgement). Breastfeed when you feel the need to reduce the fullness of your breasts or when your baby shows signs of hunger. This is called "breastfeeding on demand." Signs that your baby is hungry include:  Increased alertness, activity, or restlessness.  Movement of the head from side to side.  Opening of the mouth when the corner of the mouth or cheek is stroked (rooting).  Increased sucking sounds, smacking lips, cooing, sighing, or squeaking.  Hand-to-mouth movements and sucking on fingers or hands.  Fussing or crying.  Avoid introducing a pacifier to your baby in the first 4-6 weeks after your baby is born. After this time, you may choose to use a pacifier. Research has shown that pacifier use during  the first year of a baby's life decreases the risk of sudden infant death syndrome (SIDS). Allow your baby to feed on each breast as long as he or she wants. When your baby unlatches or falls asleep while feeding from the first breast, offer the second breast. Because newborns are often sleepy in the first few weeks of life, you may need to awaken your baby to get him or her to feed. Breastfeeding times will vary from baby to baby. However, the following rules can serve as a guide to help you make sure that your baby is properly fed:  Newborns (babies 92 weeks of age or younger) may breastfeed every 1-3 hours.  Newborns should not go without breastfeeding for longer than 3 hours during the day or 5 hours during the night.  You should breastfeed your baby a minimum of 8 times in a 24-hour period.  Breast milk pumping Pumping and storing breast milk allows you to make sure that your baby is exclusively fed your breast milk, even at times when you are unable to breastfeed. This is especially important if you go back to work while you are still breastfeeding, or if you are not able to be present during feedings. Your lactation consultant can help you find a method of pumping that works best for you and give you guidelines about how long it is safe to store breast milk. Caring for your breasts while you breastfeed Nipples can become dry, cracked, and sore while breastfeeding. The following recommendations can help keep your breasts moisturized and healthy:  Avoid using soap on your nipples.  Wear a supportive bra designed especially for nursing. Avoid wearing underwire-style bras or extremely tight bras (sports bras).  Air-dry your nipples for 3-4 minutes after each feeding.  Use only cotton bra pads to absorb leaked breast milk. Leaking of breast milk between feedings is normal.  Use lanolin on your nipples after breastfeeding. Lanolin helps to maintain your skin's normal moisture barrier. Pure  lanolin is not harmful (not toxic) to your baby. You may also hand express a few drops of breast milk and gently massage that milk into your nipples and allow the milk to air-dry.  In the first few weeks after giving birth, some women experience breast engorgement. Engorgement can make your breasts feel heavy, warm, and tender to the touch. Engorgement peaks within 3-5 days after you give birth. The following recommendations can help to ease engorgement:  Completely empty your breasts while breastfeeding or pumping. You may want to start by applying warm, moist heat (in the shower or with warm, water-soaked hand towels)  just before feeding or pumping. This increases circulation and helps the milk flow. If your baby does not completely empty your breasts while breastfeeding, pump any extra milk after he or she is finished.  Apply ice packs to your breasts immediately after breastfeeding or pumping, unless this is too uncomfortable for you. To do this: ? Put ice in a plastic bag. ? Place a towel between your skin and the bag. ? Leave the ice on for 20 minutes, 2-3 times a day.  Make sure that your baby is latched on and positioned properly while breastfeeding.  If engorgement persists after 48 hours of following these recommendations, contact your health care provider or a Science writer. Overall health care recommendations while breastfeeding  Eat 3 healthy meals and 3 snacks every day. Well-nourished mothers who are breastfeeding need an additional 450-500 calories a day. You can meet this requirement by increasing the amount of a balanced diet that you eat.  Drink enough water to keep your urine pale yellow or clear.  Rest often, relax, and continue to take your prenatal vitamins to prevent fatigue, stress, and low vitamin and mineral levels in your body (nutrient deficiencies).  Do not use any products that contain nicotine or tobacco, such as cigarettes and e-cigarettes. Your baby may  be harmed by chemicals from cigarettes that pass into breast milk and exposure to secondhand smoke. If you need help quitting, ask your health care provider.  Avoid alcohol.  Do not use illegal drugs or marijuana.  Talk with your health care provider before taking any medicines. These include over-the-counter and prescription medicines as well as vitamins and herbal supplements. Some medicines that may be harmful to your baby can pass through breast milk.  It is possible to become pregnant while breastfeeding. If birth control is desired, ask your health care provider about options that will be safe while breastfeeding your baby. Where to find more information: [employer]: www.llli.org Contact a health care provider if:  You feel like you want to stop breastfeeding or have become frustrated with breastfeeding.  Your nipples are cracked or bleeding.  Your breasts are red, tender, or warm.  You have: ? Painful breasts or nipples. ? A swollen area on either breast. ? A fever or chills. ? Nausea or vomiting. ? Drainage other than breast milk from your nipples.  Your breasts do not become full before feedings by the fifth day after you give birth.  You feel sad and depressed.  Your baby is: ? Too sleepy to eat well. ? Having trouble sleeping. ? More than 62 week old and wetting fewer than 6 diapers in a 24-hour period. ? Not gaining weight by 52 days of age.  Your baby has fewer than 3 stools in a 24-hour period.  Your baby's skin or the white parts of his or her eyes become yellow. Get help right away if:  Your baby is overly tired (lethargic) and does not want to wake up and feed.  Your baby develops an unexplained fever. Summary  Breastfeeding offers many health benefits for infant and mothers.  Try to breastfeed your infant when he or she shows early signs of hunger.  Gently tickle or stroke your baby's lips with your finger or nipple to allow the  baby to open his or her mouth. Bring the baby to your breast. Make sure that much of the areola is in your baby's mouth. Offer one side and burp the baby before you offer the  other side.  Talk with your health care provider or lactation consultant if you have questions or you face problems as you breastfeed. This information is not intended to replace advice given to you by your health care provider. Make sure you discuss any questions you have with your health care provider. Document Released: 03/31/2005 Document Revised: 05/02/2016 Document Reviewed: 05/02/2016 Elsevier Interactive Patient Education  2018 [employer]. Pain Relief During Labor and Delivery Many things can cause pain during labor and delivery, including:  Pressure on bones and ligaments due to the baby moving through the pelvis.  Stretching of tissues due to the baby moving through the birth canal.  Muscle tension due to anxiety or nervousness.  The uterus tightening (contracting) and relaxing to help move the baby.  There are many ways to deal with the pain of labor and delivery. They include:  Taking prenatal classes. Taking these classes helps you know what to expect during your baby's birth. What you learn will increase your confidence and decrease your anxiety.  Practicing relaxation techniques or doing relaxing activities, such as: ? Focused breathing. ? Meditation. ? Visualization. ? Aroma therapy. ? Listening to your favorite music. ? Hypnosis.  Taking a warm shower or bath (hydrotherapy). This may: ? Provide comfort and relaxation. ? Lessen your perception of pain. ? Decrease the amount of pain medicine needed. ? Decrease the length of labor.  Getting a massage or counterpressure on your back.  Applying warm packs or ice packs.  Changing positions often, moving around, or using a birthing ball.  Getting: ? Pain medicine through an IV or injection into a muscle. ? Pain medicine inserted into your  spinal column. ? Injections of sterile water just under the skin on your lower back (intradermal injections). ? Laughing gas (nitrous oxide).  Discuss your pain control options with your health care provider during your prenatal visits. Explore the options offered by your hospital or birth center. What kinds of medicine are available? There are two kinds of medicines that can be used to relieve pain during labor and delivery:  Analgesics. These medicines decrease pain without causing you to lose feeling or the ability to move your muscles.  Anesthetics. These medicines block feeling in the body and can decrease your ability to move freely.  Both of these kinds of medicine can cause minor side effects, such as nausea, trouble concentrating, and sleepiness. They can also decrease the baby's heart rate before birth and affect the baby's breathing rate after birth. For this reason, health care providers are careful about when and how much medicine is given. What are specific medicines and procedures that provide pain relief? Local Anesthetics Local anesthetics are used to numb a small area of the body. They may be used along with another kind of anesthetic or used to numb the nerves of the vagina, cervix, and perineum during the second stage of labor. General Anesthetics General anesthetics cause you to lose consciousness so you do not feel pain. They are usually only used for an emergency cesarean delivery. General anesthetics are given through an IV tube and a mask. Pudendal Block A pudendal block is a form of local anesthetic. It may be used to relieve the pain associated with pushing or stretching of the perineum at the time of delivery or to further numb the perineum. A pudendal block is done by injecting numbing medicine through the vaginal wall into a nerve in the pelvis. Epidural Analgesia Epidural analgesia is given through a flexible IV  catheter that is inserted into the lower back.  Numbing medicine is delivered continuously to the area near your spinal column nerves (epidural space). After having this type of analgesia, you may be able to move your legs but you most likely will not be able to walk. Depending on the amount of medicine given, you may lose all feeling in the lower half of your body, or you may retain some level of sensation, including the urge to push. Epidural analgesia can be used to provide pain relief for a vaginal birth. Spinal Block A spinal block is similar to epidural analgesia, but the medicine is injected into the spinal fluid instead of the epidural space. A spinal block is only given once. It starts to relieve pain quickly, but the pain relief lasts only 1-6 hours. Spinal blocks can be used for cesarean deliveries. Combined Spinal-Epidural (CSE) Block A CSE block combines the effects of a spinal block and epidural analgesia. The spinal block works quickly to block all pain. The epidural analgesia provides continuous pain relief, even after the effects of the spinal block have worn off. This information is not intended to replace advice given to you by your health care provider. Make sure you discuss any questions you have with your health care provider. Document Released: 07/17/2008 Document Revised: 09/07/2015 Document Reviewed: 08/22/2015 Elsevier Interactive Patient Education  2018 [place] for Pregnant Women While you are pregnant, your body will require additional nutrition to help support your growing baby. It is recommended that you consume:  150 additional calories each day during your first trimester.  300 additional calories each day during your second trimester.  300 additional calories each day during your third trimester.  Eating a healthy, well-balanced diet is very important for your health and for your baby's health. You also have a higher need for some vitamins and minerals, such as folic acid, calcium, iron, and  vitamin D. What do I need to know about eating during pregnancy?  Do not try to lose weight or go on a diet during pregnancy.  Choose healthy, nutritious foods. Choose  of a sandwich with a glass of milk instead of a candy bar or a high-calorie sugar-sweetened beverage.  Limit your overall intake of foods that have "empty calories." These are foods that have little nutritional value, such as sweets, desserts, candies, sugar-sweetened beverages, and fried foods.  Eat a variety of foods, especially fruits and vegetables.  Take a prenatal vitamin to help meet the additional needs during pregnancy, specifically for folic acid, iron, calcium, and vitamin D.  Remember to stay active. Ask your health care provider for exercise recommendations that are specific to you.  Practice good food safety and cleanliness, such as washing your hands before you eat and after you prepare raw meat. This helps to prevent foodborne illnesses, such as listeriosis, that can be very dangerous for your baby. Ask your health care provider for more information about listeriosis. What does 150 extra calories look like? Healthy options for an additional 150 calories each day could be any of the following:  Plain low-fat yogurt (6-8 oz) with  cup of berries.  1 apple with 2 teaspoons of peanut butter.  Cut-up vegetables with  cup of hummus.  Low-fat chocolate milk (8 oz or 1 cup).  1 string cheese with 1 medium orange.   of a peanut butter and jelly sandwich on whole-wheat bread (1 tsp of peanut butter).  For 300 calories, you could eat two  of those healthy options each day. What is a healthy amount of weight to gain? The recommended amount of weight for you to gain is based on your pre-pregnancy BMI. If your pre-pregnancy BMI was:  Less than 18 (underweight), you should gain 28-40 lb.  18-24.9 (normal), you should gain 25-35 lb.  25-29.9 (overweight), you should gain 15-25 lb.  Greater than 30 (obese),  you should gain 11-20 lb.  What if I am having twins or multiples? Generally, pregnant women who will be having twins or multiples may need to increase their daily calories by 300-600 calories each day. The recommended range for total weight gain is 25-54 lb, depending on your pre-pregnancy BMI. Talk with your health care provider for specific guidance about additional nutritional needs, weight gain, and exercise during your pregnancy. What foods can I eat? Grains Any grains. Try to choose whole grains, such as whole-wheat bread, oatmeal, or brown rice. Vegetables Any vegetables. Try to eat a variety of colors and types of vegetables to get a full range of vitamins and minerals. Remember to wash your vegetables well before eating. Fruits Any fruits. Try to eat a variety of colors and types of fruit to get a full range of vitamins and minerals. Remember to wash your fruits well before eating. Meats and Other Protein Sources Lean meats, including chicken, [place], fish, and lean cuts of beef, veal, or pork. Make sure that all meats are cooked to "well done." Tofu. Tempeh. Beans. Eggs. Peanut butter and other nut butters. Seafood, such as shrimp, crab, and lobster. If you choose fish, select types that are higher in omega-3 fatty acids, including salmon, herring, mussels, trout, sardines, and pollock. Make sure that all meats are cooked to food-safe temperatures. Dairy Pasteurized milk and milk alternatives. Pasteurized yogurt and pasteurized cheese. Cottage cheese. Sour cream. Beverages Water. Juices that contain 100% fruit juice or vegetable juice. Caffeine-free teas and decaffeinated coffee. Drinks that contain caffeine are okay to drink, but it is better to avoid caffeine. Keep your total caffeine intake to less than 200 mg each day (12 oz of coffee, tea, or soda) or as directed by your health care provider. Condiments Any pasteurized condiments. Sweets and Desserts Any sweets and desserts. Fats  and Oils Any fats and oils. The items listed above may not be a complete list of recommended foods or beverages. Contact your dietitian for more options. What foods are not recommended? Vegetables Unpasteurized (raw) vegetable juices. Fruits Unpasteurized (raw) fruit juices. Meats and Other Protein Sources Cured meats that have nitrates, such as bacon, salami, and hotdogs. Luncheon meats, bologna, or other deli meats (unless they are reheated until they are steaming hot). Refrigerated pate, meat spreads from a meat counter, smoked seafood that is found in the refrigerated section of a store. Raw fish, such as sushi or sashimi. High mercury content fish, such as tilefish, shark, swordfish, and king mackerel. Raw meats, such as tuna or beef tartare. Undercooked meats and poultry. Make sure that all meats are cooked to food-safe temperatures. Dairy Unpasteurized (raw) milk and any foods that have raw milk in them. Soft cheeses, such as feta, queso blanco, queso fresco, Brie, Camembert cheeses, blue-veined cheeses, and Panela cheese (unless it is made with pasteurized milk, which must be stated on the label). Beverages Alcohol. Sugar-sweetened beverages, such as sodas, teas, or energy drinks. Condiments Homemade fermented foods and drinks, such as pickles, sauerkraut, or kombucha drinks. (Store-bought pasteurized versions of these are okay.) Other Salads that are made in  the store, such as ham salad, chicken salad, egg salad, tuna salad, and seafood salad. The items listed above may not be a complete list of foods and beverages to avoid. Contact your dietitian for more information. This information is not intended to replace advice given to you by your health care provider. Make sure you discuss any questions you have with your health care provider. Document Released: 01/13/2014 Document Revised: 09/06/2015 Document Reviewed: 09/13/2013 Elsevier Interactive Patient Education  [employer].

## 2018-01-15 LAB — CBC
Hematocrit: 33.5 % — ABNORMAL LOW (ref 34.0–46.6)
Hemoglobin: 11.2 g/dL (ref 11.1–15.9)
MCH: 30.2 pg (ref 26.6–33.0)
MCHC: 33.4 g/dL (ref 31.5–35.7)
MCV: 90 fL (ref 79–97)
Platelets: 301 10*3/uL (ref 150–450)
RBC: 3.71 x10E6/uL — ABNORMAL LOW (ref 3.77–5.28)
RDW: 12.5 % (ref 12.3–15.4)
WBC: 10.5 10*3/uL (ref 3.4–10.8)

## 2018-01-15 LAB — RPR: RPR Ser Ql: NONREACTIVE

## 2018-01-15 LAB — GLUCOSE, 1 HOUR GESTATIONAL: Gestational Diabetes Screen: 168 mg/dL — ABNORMAL HIGH (ref 65–139)

## 2018-01-15 NOTE — Progress Notes (Signed)
ROB-Doing well. 28 week labs today. Blood transfusion consent reviewed and signed. TDaP and flu vaccine given. Enrolled in classes. Discussed intrapartum pain relief options including continuous labor support, [facility] volunteer doula pamphlet given. Plans breastfeeding. Desires circumcision. Husband and sister will be labor support. Education regarding weight gain in pregnancy and referral to MNT if not diabetic. Reviewed red flag symptoms and when to call. RTC x 2 weeks or sooner if needed.

## 2018-01-17 DIAGNOSIS — O2600 Excessive weight gain in pregnancy, unspecified trimester: Secondary | ICD-10-CM | POA: Insufficient documentation

## 2018-01-18 ENCOUNTER — Encounter: Payer: Self-pay | Admitting: Certified Nurse Midwife

## 2018-01-18 ENCOUNTER — Other Ambulatory Visit: Payer: Self-pay | Admitting: Certified Nurse Midwife

## 2018-01-18 DIAGNOSIS — R7309 Other abnormal glucose: Secondary | ICD-10-CM | POA: Insufficient documentation

## 2018-01-18 DIAGNOSIS — Z3403 Encounter for supervision of normal first pregnancy, third trimester: Secondary | ICD-10-CM

## 2018-01-18 DIAGNOSIS — Z131 Encounter for screening for diabetes mellitus: Secondary | ICD-10-CM

## 2018-01-19 DIAGNOSIS — Z3482 Encounter for supervision of other normal pregnancy, second trimester: Secondary | ICD-10-CM | POA: Diagnosis not present

## 2018-01-19 DIAGNOSIS — Z3483 Encounter for supervision of other normal pregnancy, third trimester: Secondary | ICD-10-CM | POA: Diagnosis not present

## 2018-01-21 ENCOUNTER — Other Ambulatory Visit: Payer: BLUE CROSS/BLUE SHIELD

## 2018-01-21 DIAGNOSIS — Z131 Encounter for screening for diabetes mellitus: Secondary | ICD-10-CM

## 2018-01-21 DIAGNOSIS — R7309 Other abnormal glucose: Secondary | ICD-10-CM

## 2018-01-21 DIAGNOSIS — Z3403 Encounter for supervision of normal first pregnancy, third trimester: Secondary | ICD-10-CM

## 2018-01-22 ENCOUNTER — Encounter: Payer: Self-pay | Admitting: Certified Nurse Midwife

## 2018-01-22 LAB — GESTATIONAL GLUCOSE TOLERANCE
Glucose, Fasting: 71 mg/dL (ref 65–94)
Glucose, GTT - 1 Hour: 243 mg/dL — ABNORMAL HIGH (ref 65–179)
Glucose, GTT - 2 Hour: 152 mg/dL (ref 65–154)
Glucose, GTT - 3 Hour: 66 mg/dL (ref 65–139)

## 2018-01-25 ENCOUNTER — Other Ambulatory Visit: Payer: Self-pay | Admitting: Certified Nurse Midwife

## 2018-01-25 DIAGNOSIS — R7309 Other abnormal glucose: Secondary | ICD-10-CM

## 2018-01-25 DIAGNOSIS — O2600 Excessive weight gain in pregnancy, unspecified trimester: Secondary | ICD-10-CM

## 2018-01-25 NOTE — Assessment & Plan Note (Signed)
Glucola: 168. 3hr: 71, 243, 152, 66.

## 2018-01-26 ENCOUNTER — Ambulatory Visit (INDEPENDENT_AMBULATORY_CARE_PROVIDER_SITE_OTHER): Payer: BLUE CROSS/BLUE SHIELD | Admitting: Certified Nurse Midwife

## 2018-01-26 VITALS — BP 104/67 | HR 103 | Wt 183.3 lb

## 2018-01-26 DIAGNOSIS — Z3403 Encounter for supervision of normal first pregnancy, third trimester: Secondary | ICD-10-CM

## 2018-01-26 LAB — POCT URINALYSIS DIPSTICK OB
Bilirubin, UA: NEGATIVE
Blood, UA: NEGATIVE
Glucose, UA: NEGATIVE
Ketones, UA: NEGATIVE
Leukocytes, UA: NEGATIVE
Nitrite, UA: NEGATIVE
POC,PROTEIN,UA: NEGATIVE
Spec Grav, UA: 1.025 (ref 1.010–1.025)
Urobilinogen, UA: 0.2 E.U./dL
pH, UA: 6.5 (ref 5.0–8.0)

## 2018-01-26 NOTE — Patient Instructions (Signed)

## 2018-01-26 NOTE — Progress Notes (Signed)
ROB,  Doing well. Feels good movement. PT asks about induction , stating that her husband only has one week off and it had to be scheduled . He has scheduled it for the week before her due date. Discussed risks of increase for cesarean section and informed her that it would depend on how favorable her cervix is. She verbalizes and agrees to plan . ROB in 2 wks.   Body mass index is 31.47 kg/m.  Doreene Burke, CNM

## 2018-01-27 ENCOUNTER — Encounter: Payer: BLUE CROSS/BLUE SHIELD | Admitting: Certified Nurse Midwife

## 2018-02-04 DIAGNOSIS — M5489 Other dorsalgia: Secondary | ICD-10-CM | POA: Diagnosis not present

## 2018-02-05 DIAGNOSIS — Z3483 Encounter for supervision of other normal pregnancy, third trimester: Secondary | ICD-10-CM | POA: Diagnosis not present

## 2018-02-05 DIAGNOSIS — Z3482 Encounter for supervision of other normal pregnancy, second trimester: Secondary | ICD-10-CM | POA: Diagnosis not present

## 2018-02-05 DIAGNOSIS — O2203 Varicose veins of lower extremity in pregnancy, third trimester: Secondary | ICD-10-CM | POA: Diagnosis not present

## 2018-02-09 ENCOUNTER — Encounter: Payer: Self-pay | Admitting: Dietician

## 2018-02-09 ENCOUNTER — Encounter: Payer: BLUE CROSS/BLUE SHIELD | Attending: Certified Nurse Midwife | Admitting: Dietician

## 2018-02-09 ENCOUNTER — Encounter: Payer: Self-pay | Admitting: Obstetrics and Gynecology

## 2018-02-09 ENCOUNTER — Ambulatory Visit (INDEPENDENT_AMBULATORY_CARE_PROVIDER_SITE_OTHER): Payer: BLUE CROSS/BLUE SHIELD | Admitting: Obstetrics and Gynecology

## 2018-02-09 VITALS — Ht 64.0 in | Wt 189.0 lb

## 2018-02-09 VITALS — BP 107/69 | HR 90 | Wt 188.8 lb

## 2018-02-09 DIAGNOSIS — O2600 Excessive weight gain in pregnancy, unspecified trimester: Secondary | ICD-10-CM

## 2018-02-09 DIAGNOSIS — Z3493 Encounter for supervision of normal pregnancy, unspecified, third trimester: Secondary | ICD-10-CM

## 2018-02-09 LAB — POCT URINALYSIS DIPSTICK OB
Bilirubin, UA: NEGATIVE
Blood, UA: NEGATIVE
Glucose, UA: NEGATIVE
Ketones, UA: NEGATIVE
Leukocytes, UA: NEGATIVE
Nitrite, UA: NEGATIVE
POC,PROTEIN,UA: NEGATIVE
Spec Grav, UA: 1.015 (ref 1.010–1.025)
Urobilinogen, UA: 0.2 E.U./dL
pH, UA: 6 (ref 5.0–8.0)

## 2018-02-09 NOTE — Progress Notes (Signed)
ROB- doing well, fell in bathroom but didn't hit anything and feels fine. Attending classes, has breast pump. Desires IOL at 39 weeks due to spouses work scheuled, will consider if cervix is favorable

## 2018-02-09 NOTE — Progress Notes (Signed)
ROB- pt is doing well 

## 2018-02-09 NOTE — Patient Instructions (Addendum)
Balance 3 meals/day with 2-4 oz protein, 2 servings of carbohydrate at breakfast and 2-4 servings of carbohydrate at lunch and dinner. Add as many non-starchy vegetables as possible.  Eat solid food at breakfast such as egg, GF toast or 1 cup oatmeal with nuts or a Kind bar with peanut butter.  Think of food guide plate when planning meals. Fill only 1/4 plate with starch, 1/4 with protein and 1/2 with non-starchy vegetables and 1 serving of fruit.  Continue to avoid beverages with sugar. Drink 6-8 cups of water daily. Limit added fats such as ranch dressing, margarine/butter or mayonnaise.

## 2018-02-09 NOTE — Progress Notes (Signed)
Medical Nutrition Therapy: Visit start time: 1540  end time: 1640  Assessment:   Diagnosis: Excessive weight gain affecting pregnancy Past medical history: abnormal glucose level on 1st hour of glucose tolerance test Psychosocial issues/ stress concerns:  None identified  Preferred learning method:  . Auditory . Visual  Current weight: 189 lbs  Height: 64 in Medications, supplements: prenatal vitamins Progress and evaluation:  Patient in for initial medical nutrition therapy visit. With EDD of 04/03/18, she is [redacted] weeks gestation.  She reports that prior to pregnancy, she was following the Keto diet and had lost 15 lbs. She has gained approximately 42 lbs during the pregnancy with a gain of 7 lbs in the past month. She reports a gluten intolerance and she follows a gluten free diet. She eats 3 meals daily and 1-2 snacks. She states that she re-introduced more carbohydrate once she was pregnant but  doesn't feel that she has eaten excessively during the pregnancy.Her present intake is low in fruits, vegetables and whole grains (partially due to gluten intolerance). Her main beverage is water and she does not drink sugar sweetened beverages.   Physical activity: none  Dietary Intake:  Usual eating pattern includes 3 meals and 1-2 snacks per day. Dining out frequency: 3-4 meals per week (mainly on weekends).  Breakfast: 7:30am- protein shake or smoothie made with banana, yogurt, peanut butter and coconut milk) + a Kind bar (17-20 gms carbohydrate). Snack:occasionally a Kind bar if didn't eat for breakfast Lunch:12-1:00pm- peanut butter sandwich on GF bread, lite yogurt Snack:usually no snack Supper: 6-7:00pm- grilled chicken, potato or ground [place] burger, rice or grilled or sauteed meat, salad, rice, or example of a meal "out"- chicken wings, fries, celery and ranch or Moe's rice/chicken, lettuce and chips, water; sometimes with Crystal lite added. Snack: usually no evening  snack Beverages: water or sugar free flavored water  Nutrition Care Education: Prenatal nutrition:  Instructed on a prenatal meal plan based on 2000 calories including carbohydrate counting and how to better balance carbohydrate, protein, small amounts of fat and non-starchy vegetables. Used food guide plate and food models as visuals for portions and balance of nutrients. Encouraged to use meal plan as a guide to help with mindfulness regarding food choices and food combinations. Discussed how this meal pattern can help with weight management and also glucose control. Discussed limiting added fats such as ranch dressing, margarine/butter, etc. Discussed making lower fat choices when eating "out" and strategies such as  "take out" box for part of the meal. Reviewed food safety such as preventing listeriosis, food borne illness and gave information on guidelines regarding eating fish during pregnancy. Nutritional Diagnosis:  [place]-3.4 Unintentional weight gain As related to high calorie meals "out" especially on weekends, low intake of lower calorie foods such as fruits and vegetables, lack of physical activity.  As evidenced by diet history and gain of 40 lbs at [redacted] weeks gestation..  Intervention:  Balance 3 meals/day with 2-4 oz protein, 2 servings of carbohydrate at breakfast and 2-4 servings of carbohydrate at lunch and dinner. Add as many non-starchy vegetables as possible.  Eat solid food at breakfast such as egg, GF toast or 1 cup oatmeal with nuts or a Kind bar with peanut butter.  Think of food guide plate when planning meals. Fill only 1/4 plate with starch, 1/4 with protein and 1/2 with non-starchy vegetables and 1 serving of fruit.  Continue to avoid beverages with sugar. Drink 6-8 cups of water daily. Limit added fats such  as ranch dressing, margarine/butter or mayonnaise.  Education Materials given:  . Plate Planner . Food lists/ Planning A Balanced Meal . Sample meal pattern/  menus . Goals/ instructions  Learner/ who was taught:  . Patient   Level of understanding: Verbalizes understanding Demonstrated degree of understanding via:   Teach back Learning barriers: . None  Willingness to learn/ readiness for change: . Acceptance, ready for change  Monitoring and Evaluation:  Dietary intake, exercise, , and body weight      follow up: 03/02/2018 at 9:00am

## 2018-02-23 ENCOUNTER — Ambulatory Visit (INDEPENDENT_AMBULATORY_CARE_PROVIDER_SITE_OTHER): Payer: BLUE CROSS/BLUE SHIELD | Admitting: Certified Nurse Midwife

## 2018-02-23 ENCOUNTER — Encounter: Payer: BLUE CROSS/BLUE SHIELD | Admitting: Certified Nurse Midwife

## 2018-02-23 VITALS — BP 102/69 | HR 99 | Wt 193.2 lb

## 2018-02-23 DIAGNOSIS — K5900 Constipation, unspecified: Secondary | ICD-10-CM | POA: Insufficient documentation

## 2018-02-23 DIAGNOSIS — M549 Dorsalgia, unspecified: Secondary | ICD-10-CM

## 2018-02-23 DIAGNOSIS — O9989 Other specified diseases and conditions complicating pregnancy, childbirth and the puerperium: Secondary | ICD-10-CM

## 2018-02-23 DIAGNOSIS — O99613 Diseases of the digestive system complicating pregnancy, third trimester: Secondary | ICD-10-CM

## 2018-02-23 DIAGNOSIS — O99891 Other specified diseases and conditions complicating pregnancy: Secondary | ICD-10-CM

## 2018-02-23 DIAGNOSIS — Z3493 Encounter for supervision of normal pregnancy, unspecified, third trimester: Secondary | ICD-10-CM

## 2018-02-23 LAB — POCT URINALYSIS DIPSTICK OB
Bilirubin, UA: NEGATIVE
Blood, UA: NEGATIVE
Glucose, UA: NEGATIVE
Ketones, UA: NEGATIVE
Leukocytes, UA: NEGATIVE
Nitrite, UA: NEGATIVE
POC,PROTEIN,UA: NEGATIVE
Spec Grav, UA: <=1.005 — AB (ref 1.010–1.025)
Urobilinogen, UA: 0.2 E.U./dL
pH, UA: 6.5 (ref 5.0–8.0)

## 2018-02-23 MED ORDER — POLYETHYLENE GLYCOL 3350 17 GM/SCOOP PO POWD: 1.0000 | Freq: Once | ORAL | 0 refills | Status: AC

## 2018-02-23 MED ORDER — DOCUSATE SODIUM 100 MG PO CAPS: 100.0000 mg | ORAL_CAPSULE | Freq: Two times a day (BID) | ORAL | 2 refills | Status: DC | PRN

## 2018-02-23 NOTE — Patient Instructions (Addendum)
WHAT OB PATIENTS CAN EXPECT   Confirmation of pregnancy and ultrasound ordered if medically indicated-[redacted] weeks gestation  New OB (NOB) intake with nurse and New OB (NOB) labs- [redacted] weeks gestation  New OB (NOB) physical examination with provider- 11/[redacted] weeks gestation  Flu vaccine-[redacted] weeks gestation  Anatomy scan-[redacted] weeks gestation  Glucose tolerance test, blood work to test for anemia, T-dap vaccine-[redacted] weeks gestation  Vaginal swabs/cultures-STD/Group B strep-[redacted] weeks gestation  Appointments every 4 weeks until 28 weeks  Every 2 weeks from 28 weeks until 36 weeks  Weekly visits from 36 weeks until delivery   Vaginal Delivery Vaginal delivery means that you will give birth by pushing your baby out of your birth canal (vagina). A team of health care providers will help you before, during, and after vaginal delivery. Birth experiences are unique for every woman and every pregnancy, and birth experiences vary depending on where you choose to give birth. What should I do to prepare for my baby's birth? Before your baby is born, it is important to talk with your health care provider about:  Your labor and delivery preferences. These may include: ? Medicines that you may be given. ? How you will manage your pain. This might include non-medical pain relief techniques or injectable pain relief such as epidural analgesia. ? How you and your baby will be monitored during labor and delivery. ? Who may be in the labor and delivery room with you. ? Your feelings about surgical delivery of your baby (cesarean delivery, or C-section) if this becomes necessary. ? Your feelings about receiving donated blood through an IV tube (blood transfusion) if this becomes necessary.  Whether you are able: ? To take pictures or videos of the birth. ? To eat during labor and delivery. ? To move around, walk, or change positions during labor and delivery.  What to expect after your baby is born, such  as: ? Whether delayed umbilical cord clamping and cutting is offered. ? Who will care for your baby right after birth. ? Medicines or tests that may be recommended for your baby. ? Whether breastfeeding is supported in your hospital or birth center. ? How long you will be in the hospital or birth center.  How any medical conditions you have may affect your baby or your labor and delivery experience.  To prepare for your baby's birth, you should also:  Attend all of your health care visits before delivery (prenatal visits) as recommended by your health care provider. This is important.  Prepare your home for your baby's arrival. Make sure that you have: ? Diapers. ? Baby clothing. ? Feeding equipment. ? Safe sleeping arrangements for you and your baby.  Install a car seat in your vehicle. Have your car seat checked by a certified car seat installer to make sure that it is installed safely.  Think about who will help you with your new baby at home for at least the first several weeks after delivery.  What can I expect when I arrive at the birth center or hospital? Once you are in labor and have been admitted into the hospital or birth center, your health care provider may:  Review your pregnancy history and any concerns you have.  Insert an IV tube into one of your veins. This is used to give you fluids and medicines.  Check your blood pressure, pulse, temperature, and heart rate (vital signs).  Check whether your bag of water (amniotic sac) has broken (ruptured).  Talk with   you about your birth plan and discuss pain control options.  Monitoring Your health care provider may monitor your contractions (uterine monitoring) and your baby's heart rate (fetal monitoring). You may need to be monitored:  Often, but not continuously (intermittently).  All the time or for long periods at a time (continuously). Continuous monitoring may be needed if: ? You are taking certain medicines,  such as medicine to relieve pain or make your contractions stronger. ? You have pregnancy or labor complications.  Monitoring may be done by:  Placing a special stethoscope or a handheld monitoring device on your abdomen to check your baby's heartbeat, and feeling your abdomen for contractions. This method of monitoring does not continuously record your baby's heartbeat or your contractions.  Placing monitors on your abdomen (external monitors) to record your baby's heartbeat and the frequency and length of contractions. You may not have to wear external monitors all the time.  Placing monitors inside of your uterus (internal monitors) to record your baby's heartbeat and the frequency, length, and strength of your contractions. ? Your health care provider may use internal monitors if he or she needs more information about the strength of your contractions or your baby's heart rate. ? Internal monitors are put in place by passing a thin, flexible wire through your vagina and into your uterus. Depending on the type of monitor, it may remain in your uterus or on your baby's head until birth. ? Your health care provider will discuss the benefits and risks of internal monitoring with you and will ask for your permission before inserting the monitors.  Telemetry. This is a type of continuous monitoring that can be done with external or internal monitors. Instead of having to stay in bed, you are able to move around during telemetry. Ask your health care provider if telemetry is an option for you.  Physical exam Your health care provider may perform a physical exam. This may include:  Checking whether your baby is positioned: ? With the head toward your vagina (head-down). This is most common. ? With the head toward the top of your uterus (head-up or breech). If your baby is in a breech position, your health care provider may try to turn your baby to a head-down position so you can deliver vaginally.  If it does not seem that your baby can be born vaginally, your provider may recommend surgery to deliver your baby. In rare cases, you may be able to deliver vaginally if your baby is head-up (breech delivery). ? Lying sideways (transverse). Babies that are lying sideways cannot be delivered vaginally.  Checking your cervix to determine: ? Whether it is thinning out (effacing). ? Whether it is opening up (dilating). ? How low your baby has moved into your birth canal.  What are the three stages of labor and delivery?  Normal labor and delivery is divided into the following three stages: Stage 1  Stage 1 is the longest stage of labor, and it can last for hours or days. Stage 1 includes: ? Early labor. This is when contractions may be irregular, or regular and mild. Generally, early labor contractions are more than 10 minutes apart. ? Active labor. This is when contractions get longer, more regular, more frequent, and more intense. ? The transition phase. This is when contractions happen very close together, are very intense, and may last longer than during any other part of labor.  Contractions generally feel mild, infrequent, and irregular at first. They get stronger, more  frequent (about every 2-3 minutes), and more regular as you progress from early labor through active labor and transition.  Many women progress through stage 1 naturally, but you may need help to continue making progress. If this happens, your health care provider may talk with you about: ? Rupturing your amniotic sac if it has not ruptured yet. ? Giving you medicine to help make your contractions stronger and more frequent.  Stage 1 ends when your cervix is completely dilated to 4 inches (10 cm) and completely effaced. This happens at the end of the transition phase. Stage 2  Once your cervix is completely effaced and dilated to 4 inches (10 cm), you may start to feel an urge to push. It is common for the body to  naturally take a rest before feeling the urge to push, especially if you received an epidural or certain other pain medicines. This rest period may last for up to 1-2 hours, depending on your unique labor experience.  During stage 2, contractions are generally less painful, because pushing helps relieve contraction pain. Instead of contraction pain, you may feel stretching and burning pain, especially when the widest part of your baby's head passes through the vaginal opening (crowning).  Your health care provider will closely monitor your pushing progress and your baby's progress through the vagina during stage 2.  Your health care provider may massage the area of skin between your vaginal opening and anus (perineum) or apply warm compresses to your perineum. This helps it stretch as the baby's head starts to crown, which can help prevent perineal tearing. ? In some cases, an incision may be made in your perineum (episiotomy) to allow the baby to pass through the vaginal opening. An episiotomy helps to make the opening of the vagina larger to allow more room for the baby to fit through.  It is very important to breathe and focus so your health care provider can control the delivery of your baby's head. Your health care provider may have you decrease the intensity of your pushing, to help prevent perineal tearing.  After delivery of your baby's head, the shoulders and the rest of the body generally deliver very quickly and without difficulty.  Once your baby is delivered, the umbilical cord may be cut right away, or this may be delayed for 1-2 minutes, depending on your baby's health. This may vary among health care providers, hospitals, and birth centers.  If you and your baby are healthy enough, your baby may be placed on your chest or abdomen to help maintain the baby's temperature and to help you bond with each other. Some mothers and babies start breastfeeding at this time. Your health care team  will dry your baby and help keep your baby warm during this time.  Your baby may need immediate care if he or she: ? Showed signs of distress during labor. ? Has a medical condition. ? Was born too early (prematurely). ? Had a bowel movement before birth (meconium). ? Shows signs of difficulty transitioning from being inside the uterus to being outside of the uterus. If you are planning to breastfeed, your health care team will help you begin a feeding. Stage 3  The third stage of labor starts immediately after the birth of your baby and ends after you deliver the placenta. The placenta is an organ that develops during pregnancy to provide oxygen and nutrients to your baby in the womb.  Delivering the placenta may require some pushing, and you   may have mild contractions. Breastfeeding can stimulate contractions to help you deliver the placenta.  After the placenta is delivered, your uterus should tighten (contract) and become firm. This helps to stop bleeding in your uterus. To help your uterus contract and to control bleeding, your health care provider may: ? Give you medicine by injection, through an IV tube, by mouth, or through your rectum (rectally). ? Massage your abdomen or perform a vaginal exam to remove any blood clots that are left in your uterus. ? Empty your bladder by placing a thin, flexible tube (catheter) into your bladder. ? Encourage you to breastfeed your baby. After labor is over, you and your baby will be monitored closely to ensure that you are both healthy until you are ready to go home. Your health care team will teach you how to care for yourself and your baby. This information is not intended to replace advice given to you by your health care provider. Make sure you discuss any questions you have with your health care provider. Document Released: 01/08/2008 Document Revised: 10/19/2015 Document Reviewed: 04/15/2015 Elsevier Interactive Patient Education  2018  [employer].  Constipation, Adult Constipation is when a person:  Poops (has a bowel movement) fewer times in a week than normal.  Has a hard time pooping.  Has poop that is dry, hard, or bigger than normal.  Follow these instructions at home: Eating and drinking   Eat foods that have a lot of fiber, such as: ? Fresh fruits and vegetables. ? Whole grains. ? Beans.  Eat less of foods that are high in fat, low in fiber, or overly processed, such as: ? [place] fries. ? Hamburgers. ? Cookies. ? Candy. ? Soda.  Drink enough fluid to keep your pee (urine) clear or pale yellow. General instructions  Exercise regularly or as told by your doctor.  Go to the restroom when you feel like you need to poop. Do not hold it in.  Take over-the-counter and prescription medicines only as told by your doctor. These include any fiber supplements.  Do pelvic floor retraining exercises, such as: ? Doing deep breathing while relaxing your lower belly (abdomen). ? Relaxing your pelvic floor while pooping.  Watch your condition for any changes.  Keep all follow-up visits as told by your doctor. This is important. Contact a doctor if:  You have pain that gets worse.  You have a fever.  You have not pooped for 4 days.  You throw up (vomit).  You are not hungry.  You lose weight.  You are bleeding from the anus.  You have thin, pencil-like poop (stool). Get help right away if:  You have a fever, and your symptoms suddenly get worse.  You leak poop or have blood in your poop.  Your belly feels hard or bigger than normal (is bloated).  You have very bad belly pain.  You feel dizzy or you faint. This information is not intended to replace advice given to you by your health care provider. Make sure you discuss any questions you have with your health care provider. Document Released: 09/17/2007 Document Revised: 10/19/2015 Document Reviewed: 09/19/2015 Elsevier Interactive  Patient Education  2018 [employer].  Back Pain in Pregnancy Back pain during pregnancy is common. Back pain may be caused by several factors that are related to changes during your pregnancy. Follow these instructions at home: Managing pain, stiffness, and swelling  If directed, apply ice for sudden (acute) back pain. ? Put ice in a  plastic bag. ? Place a towel between your skin and the bag. ? Leave the ice on for 20 minutes, 2-3 times per day.  If directed, apply heat to the affected area before you exercise: ? Place a towel between your skin and the heat pack or heating pad. ? Leave the heat on for 20-30 minutes. ? Remove the heat if your skin turns bright red. This is especially important if you are unable to feel pain, heat, or cold. You may have a greater risk of getting burned. Activity  Exercise as told by your health care provider. Exercising is the best way to prevent or manage back pain.  Listen to your body when lifting. If lifting hurts, ask for help or bend your knees. This uses your leg muscles instead of your back muscles.  Squat down when picking up something from the floor. Do not bend over.  Only use bed rest as told by your health care provider. Bed rest should only be used for the most severe episodes of back pain. Standing, Sitting, and Lying Down  Do not stand in one place for long periods of time.  Use good posture when sitting. Make sure your head rests over your shoulders and is not hanging forward. Use a pillow on your lower back if necessary.  Try sleeping on your side, preferably the left side, with a pillow or two between your legs. If you are sore after a night's rest, your bed may be too soft. A firm mattress may provide more support for your back during pregnancy. General instructions  Do not wear high heels.  Eat a healthy diet. Try to gain weight within your health care provider's recommendations.  Use a maternity girdle, elastic sling, or  back brace as told by your health care provider.  Take over-the-counter and prescription medicines only as told by your health care provider.  Keep all follow-up visits as told by your health care provider. This is important. This includes any visits with any specialists, such as a physical therapist. Contact a health care provider if:  Your back pain interferes with your daily activities.  You have increasing pain in other parts of your body. Get help right away if:  You develop numbness, tingling, weakness, or problems with the use of your arms or legs.  You develop severe back pain that is not controlled with medicine.  You have a sudden change in bowel or bladder control.  You develop shortness of breath, dizziness, or you faint.  You develop nausea, vomiting, or sweating.  You have back pain that is a rhythmic, cramping pain similar to labor pains. Labor pain is usually 1-2 minutes apart, lasts for about 1 minute, and involves a bearing down feeling or pressure in your pelvis.  You have back pain and your water breaks or you have vaginal bleeding.  You have back pain or numbness that travels down your leg.  Your back pain developed after you fell.  You develop pain on one side of your back.  You see blood in your urine.  You develop skin blisters in the area of your back pain. This information is not intended to replace advice given to you by your health care provider. Make sure you discuss any questions you have with your health care provider. Document Released: 07/09/2005 Document Revised: 09/06/2015 Document Reviewed: 12/13/2014 Elsevier Interactive Patient Education  2018 [employer] Common Medications Safe in Pregnancy  Acne:      Constipation:  Benzoyl Peroxide       Colace  Clindamycin      Dulcolax Suppository  Topica Erythromycin     Fibercon  Salicylic Acid      Metamucil         Miralax AVOID:        Senakot   Accutane    Cough:  Retin-A       Cough  Drops  Tetracycline      Phenergan w/ Codeine if Rx  Minocycline      Robitussin (Plain & DM)  Antibiotics:     Crabs/Lice:  Ceclor       RID  Cephalosporins    AVOID:  E-Mycins      Kwell  Keflex  Macrobid/Macrodantin   Diarrhea:  Penicillin      Kao-Pectate  Zithromax      Imodium AD         PUSH FLUIDS AVOID:       Cipro     Fever:  Tetracycline      Tylenol (Regular or Extra  Minocycline       Strength)  Levaquin      Extra Strength-Do not          Exceed 8 tabs/24 hrs Caffeine:        <200mg/day (equiv. To 1 cup of coffee or  approx. 3 12 oz sodas)         Gas: Cold/Hayfever:       Gas-X  Benadryl      Mylicon  Claritin       Phazyme  **Claritin-D        Chlor-Trimeton    Headaches:  Dimetapp      ASA-Free Excedrin  Drixoral-Non-Drowsy     Cold Compress  Mucinex (Guaifenasin)     Tylenol (Regular or Extra  Sudafed/Sudafed-12 Hour     Strength)  **Sudafed PE Pseudoephedrine   Tylenol Cold & Sinus     Vicks Vapor Rub  Zyrtec  **AVOID if Problems With Blood Pressure         Heartburn: Avoid lying down for at least 1 hour after meals  Aciphex      Maalox     Rash:  Milk of Magnesia     Benadryl    Mylanta       1% Hydrocortisone Cream  Pepcid  Pepcid Complete   Sleep Aids:  Prevacid      Ambien   Prilosec       Benadryl  Rolaids       Chamomile Tea  Tums (Limit 4/day)     Unisom         Tylenol PM         Warm milk-add vanilla or  Hemorrhoids:       Sugar for taste  Anusol/Anusol H.C.  (RX: Analapram 2.5%)  Sugar Substitutes:  Hydrocortisone OTC     Ok in moderation  Preparation H      Tucks        Vaseline lotion applied to tissue with wiping    Herpes:     Throat:  Acyclovir      Oragel  Famvir  Valtrex     Vaccines:         Flu Shot Leg Cramps:       *Gardasil  Benadryl      Hepatitis A         Hepatitis B Nasal Spray:       Pneumovax  Saline Nasal Spray     Polio Booster           Tetanus Nausea:       Tuberculosis test or PPD  Vitamin  B6 25 mg TID   AVOID:    Dramamine      *Gardasil  Emetrol       Live Poliovirus  Ginger Root 250 mg QID    MMR (measles, mumps &  High Complex Carbs @ Bedtime    rebella)  Sea Bands-Accupressure    Varicella (Chickenpox)  Unisom 1/2 tab TID     *No known complications           If received before Pain:         Known pregnancy;   Darvocet       Resume series after  Lortab        Delivery  Percocet    Yeast:   Tramadol      Femstat  Tylenol 3      Gyne-lotrimin  Ultram       Monistat  Vicodin           MISC:         All Sunscreens           Hair Coloring/highlights          Insect Repellant's          (Including DEET)         Mystic Tans

## 2018-02-23 NOTE — Progress Notes (Signed)
ROB-Reports constipation and intermittent back pain. New abdominal support just arrived in mail. Discussed home treatment measures. Rx: Miralax and Colace, see orders. Desires elective IOL at 39 weeks due to spouse schedule per previous midwife will consider if cervix favorable. Anticipatory guidance regarding course of prenatal care including HSV prophylaxis and GBS screening. Herbal prep, packing list, and pre-labor checklist given. Reviewed red flag symptoms and when to call. RTC x 2 weeks for 36 week cultures and ROB or sooner if needed.

## 2018-02-23 NOTE — Progress Notes (Signed)
ROB, c/o intermittent vaginal pressure for the past week.

## 2018-03-02 ENCOUNTER — Encounter: Payer: BLUE CROSS/BLUE SHIELD | Attending: Certified Nurse Midwife | Admitting: Dietician

## 2018-03-02 ENCOUNTER — Encounter: Payer: Self-pay | Admitting: Dietician

## 2018-03-02 VITALS — Wt 196.3 lb

## 2018-03-02 DIAGNOSIS — O2600 Excessive weight gain in pregnancy, unspecified trimester: Secondary | ICD-10-CM

## 2018-03-02 NOTE — Progress Notes (Signed)
Medical Nutrition Therapy Follow-up visit:  Time with patient: 9:00am-9:30am Visit #:2 ASSESSMENT:  Diagnosis: Excessive weight gain during pregnancy Current weight:196.3 lbs    Height: 64 in Medications: prenatal vitamins, colace  Progress and evaluation: Patient expressed frustration that her rate of weight gain did not decrease. She gained 7.3 lbs in the past 3 weeks. She gives a typical pattern of 9:00am- Kind bar, water 12-1:00pm- peanut butter sandwich, yogurt, water Usually no afternoon snack; occasionally eats another Kind bar 6-7:00pm- Ex. Last night- crock pot meal of chicken, rice and tomato soup, unsweetened tea Doesn't typically eat an evening snack but occasionally eats a few pecans. She has not introduced fruits and cannot name any vegetables that she likes other than lettuce. States that she can eat broccoli and raw spinach and celery if has ranch dressing. She states that most meals are prepared at home verses "take out" or dining "out". Her main beverage is water; drinks no sweetened beverages. Her  meal pattern and food choices do not explain rate of weight gain.  Physical activity: none  NUTRITION CARE EDUCATION: Prenatal nutrition: Encouraged to focus on prenatal nutrition needs and ways to better meet those needs verses focus on weight. Made suggestions to increase calcium in her diet. Also, encouraged to add a small serving of fruit with meals as well as to switch out some of the Kind bars for fruit or yogurt. Reviewed portions on starchy foods, acknowledging that it makes it more difficult to control starch portions when no non-starchy vegetables are included to balance out the meal. Discussed ways to begin to introduce such as adding spinach to lettuce to increase nutrients and add thinly shredded carrots to lettuce/spinach salad.  Encouraged again to limit added fats such as ranch, [employer]ist information centre managermargarine/butter or mayonnaise.  INTERVENTION:  When eating a snack, include  a food that diet is low in such as a fruit serving or yogurt. Include yogurt 2 times daily to help increase calcium in the diet. Continue to eat 3 meals daily at consistent times with 1-2 snacks if hungry between meals. Think of food guide plate when balancing protein, starch (1/4 plate or 1 cup if measurable) and try small portions of non-starchy vegetables to help acquire a taste. Can add a small serving of fruit or yogurt any meal.  EDUCATION MATERIALS GIVEN:  . Goals/ instructions LEARNER/ who was taught:  . Patient  LEVEL OF UNDERSTANDING:  Verbalizes understanding  LEARNING BARRIERS: . None  WILLINGNESS TO LEARN/READINESS FOR CHANGE: . Acceptance  MONITORING AND EVALUATION:   No follow-up scheduled. Patient was encouraged to call if desires further help with her diet/nutrition.

## 2018-03-02 NOTE — Patient Instructions (Signed)
When eating a snack, include a food that diet is low in such as a fruit serving or yogurt. Include yogurt 2 times daily to help increase calcium in the diet. Continue to eat 3 meals daily at consistent times with 1-2 snacks if hungry between meals. Think of food guide plate when balancing protein, starch (1/4 plate or 1 cup if measurable) and try small portions of non-starchy vegetables to help acquire a taste. Can add a small serving of fruit or yogurt any meal.

## 2018-03-09 ENCOUNTER — Ambulatory Visit (INDEPENDENT_AMBULATORY_CARE_PROVIDER_SITE_OTHER): Payer: BLUE CROSS/BLUE SHIELD | Admitting: Certified Nurse Midwife

## 2018-03-09 ENCOUNTER — Encounter: Payer: Self-pay | Admitting: Certified Nurse Midwife

## 2018-03-09 VITALS — BP 107/71 | HR 86 | Wt 196.2 lb

## 2018-03-09 DIAGNOSIS — Z3493 Encounter for supervision of normal pregnancy, unspecified, third trimester: Secondary | ICD-10-CM | POA: Diagnosis not present

## 2018-03-09 LAB — POCT URINALYSIS DIPSTICK OB
Bilirubin, UA: NEGATIVE
Blood, UA: NEGATIVE
Glucose, UA: NEGATIVE
Ketones, UA: NEGATIVE
Leukocytes, UA: NEGATIVE
Nitrite, UA: NEGATIVE
POC,PROTEIN,UA: NEGATIVE
Spec Grav, UA: 1.02 (ref 1.010–1.025)
Urobilinogen, UA: 0.2 E.U./dL
pH, UA: 6.5 (ref 5.0–8.0)

## 2018-03-09 MED ORDER — VALACYCLOVIR HCL 1 G PO TABS: 1000.0000 mg | ORAL_TABLET | Freq: Every day | ORAL | 2 refills | Status: DC

## 2018-03-09 NOTE — Addendum Note (Signed)
Addended by: Brooke DareSICK, Sunita Demond L on: 03/09/2018 09:42 AM   Modules accepted: Orders

## 2018-03-09 NOTE — Patient Instructions (Signed)
Group B Streptococcus Infection During Pregnancy Group B Streptococcus (GBS) is a type of bacteria (Streptococcus agalactiae) that is often found in healthy people, commonly in the rectum, vagina, and intestines. In people who are healthy and not pregnant, the bacteria rarely cause serious illness or complications. However, women who test positive for GBS during pregnancy can pass the bacteria to their baby during childbirth, which can cause serious infection in the baby after birth. Women with GBS may also have infections during their pregnancy or immediately after childbirth, such as such as urinary tract infections (UTIs) or infections of the uterus (uterine infections). Having GBS also increases a woman's risk of complications during pregnancy, such as early (preterm) labor or delivery, miscarriage, or stillbirth. Routine testing (screening) for GBS is recommended for all pregnant women. What increases the risk? You may have a higher risk for GBS infection during pregnancy if you had one during a past pregnancy. What are the signs or symptoms? In most cases, GBS infection does not cause symptoms in pregnant women. Signs and symptoms of a possible GBS-related infection may include:  Labor starting before the 37th week of pregnancy.  A UTI or bladder infection, which may cause: ? Fever. ? Pain or burning during urination. ? Frequent urination.  Fever during labor, along with: ? Bad-smelling discharge. ? Uterine tenderness. ? Rapid heartbeat in the mother, baby, or both.  Rare but serious symptoms of a possible GBS-related infection in women include:  Blood infection (septicemia). This may cause fever, chills, or confusion.  Lung infection (pneumonia). This may cause fever, chills, cough, rapid breathing, difficulty breathing, or chest pain.  Bone, joint, skin, or soft tissue infection.  How is this diagnosed? You may be screened for GBS between week 35 and week 37 of your pregnancy. If  you have symptoms of preterm labor, you may be screened earlier. This condition is diagnosed based on lab test results from:  A swab of fluid from the vagina and rectum.  A urine sample.  How is this treated? This condition is treated with antibiotic medicine. When you go into labor, or as soon as your water breaks (your membranes rupture), you will be given antibiotics through an IV tube. Antibiotics will continue until after you give birth. If you are having a cesarean delivery, you do not need antibiotics unless your membranes have already ruptured. Follow these instructions at home:  Take over-the-counter and prescription medicines only as told by your health care provider.  Take your antibiotic medicine as told by your health care provider. Do not stop taking the antibiotic even if you start to feel better.  Keep all pre-birth (prenatal) visits and follow-up visits as told by your health care provider. This is important. Contact a health care provider if:  You have pain or burning when you urinate.  You have to urinate frequently.  You have a fever or chills.  You develop a bad-smelling vaginal discharge. Get help right away if:  Your membranes rupture.  You go into labor.  You have severe pain in your abdomen.  You have difficulty breathing.  You have chest pain. This information is not intended to replace advice given to you by your health care provider. Make sure you discuss any questions you have with your health care provider. Document Released: 07/08/2007 Document Revised: 10/26/2015 Document Reviewed: 10/25/2015 Elsevier Interactive Patient Education  2018 [employer] Inc[employer]  

## 2018-03-09 NOTE — Progress Notes (Signed)
ROB, doing well. GBS and cultures today. HSV suppression started today. Pt has concern regarding nutriention consults- were not covered.Will follow up with billing. Labor precautions reviewed. Follow up 1 wk.   Doreene BurkeAnnie Cia Garretson, CNM

## 2018-03-11 LAB — STREP GP B NAA: Strep Gp B NAA: POSITIVE — AB

## 2018-03-12 LAB — GC/CHLAMYDIA PROBE AMP
Chlamydia trachomatis, NAA: NEGATIVE
Neisseria gonorrhoeae by PCR: NEGATIVE

## 2018-03-16 ENCOUNTER — Encounter: Payer: BLUE CROSS/BLUE SHIELD | Admitting: Obstetrics and Gynecology

## 2018-03-16 ENCOUNTER — Ambulatory Visit (INDEPENDENT_AMBULATORY_CARE_PROVIDER_SITE_OTHER): Payer: BLUE CROSS/BLUE SHIELD | Admitting: Certified Nurse Midwife

## 2018-03-16 VITALS — BP 103/67 | HR 83 | Wt 202.9 lb

## 2018-03-16 DIAGNOSIS — Z3493 Encounter for supervision of normal pregnancy, unspecified, third trimester: Secondary | ICD-10-CM

## 2018-03-16 DIAGNOSIS — B951 Streptococcus, group B, as the cause of diseases classified elsewhere: Secondary | ICD-10-CM | POA: Insufficient documentation

## 2018-03-16 LAB — POCT URINALYSIS DIPSTICK OB
Bilirubin, UA: NEGATIVE
Blood, UA: NEGATIVE
Glucose, UA: NEGATIVE
Ketones, UA: NEGATIVE
Leukocytes, UA: NEGATIVE
Nitrite, UA: NEGATIVE
POC,PROTEIN,UA: NEGATIVE
Spec Grav, UA: 1.01 (ref 1.010–1.025)
Urobilinogen, UA: 0.2 E.U./dL
pH, UA: 6 (ref 5.0–8.0)

## 2018-03-16 NOTE — Progress Notes (Signed)
ROB, patient c/o intermittent vaginal pressure with physical activity.

## 2018-03-16 NOTE — Progress Notes (Signed)
ROB-Report fatigue, increased pelvic pressure and irregular contractions. Discussed home treatment measures. Taking Valtrex daily. Education regarding GBS prophylaxis in labor. Desires IOL at 39 weeks, Bishop score explained in detail. Cervix unfavorable at this time. Anticipatory guidance regarding course of prenatal care. Reviewed red flag symptoms and when to call. RTC x 1 week for ROB or sooner.

## 2018-03-16 NOTE — Patient Instructions (Signed)
Vaginal Delivery Vaginal delivery means that you will give birth by pushing your baby out of your birth canal (vagina). A team of health care providers will help you before, during, and after vaginal delivery. Birth experiences are unique for every woman and every pregnancy, and birth experiences vary depending on where you choose to give birth. What should I do to prepare for my baby's birth? Before your baby is born, it is important to talk with your health care provider about:  Your labor and delivery preferences. These may include: ? Medicines that you may be given. ? How you will manage your pain. This might include non-medical pain relief techniques or injectable pain relief such as epidural analgesia. ? How you and your baby will be monitored during labor and delivery. ? Who may be in the labor and delivery room with you. ? Your feelings about surgical delivery of your baby (cesarean delivery, or C-section) if this becomes necessary. ? Your feelings about receiving donated blood through an IV tube (blood transfusion) if this becomes necessary.  Whether you are able: ? To take pictures or videos of the birth. ? To eat during labor and delivery. ? To move around, walk, or change positions during labor and delivery.  What to expect after your baby is born, such as: ? Whether delayed umbilical cord clamping and cutting is offered. ? Who will care for your baby right after birth. ? Medicines or tests that may be recommended for your baby. ? Whether breastfeeding is supported in your hospital or birth center. ? How long you will be in the hospital or birth center.  How any medical conditions you have may affect your baby or your labor and delivery experience.  To prepare for your baby's birth, you should also:  Attend all of your health care visits before delivery (prenatal visits) as recommended by your health care provider. This is important.  Prepare your home for your baby's  arrival. Make sure that you have: ? Diapers. ? Baby clothing. ? Feeding equipment. ? Safe sleeping arrangements for you and your baby.  Install a car seat in your vehicle. Have your car seat checked by a certified car seat installer to make sure that it is installed safely.  Think about who will help you with your new baby at home for at least the first several weeks after delivery.  What can I expect when I arrive at the birth center or hospital? Once you are in labor and have been admitted into the hospital or birth center, your health care provider may:  Review your pregnancy history and any concerns you have.  Insert an IV tube into one of your veins. This is used to give you fluids and medicines.  Check your blood pressure, pulse, temperature, and heart rate (vital signs).  Check whether your bag of water (amniotic sac) has broken (ruptured).  Talk with you about your birth plan and discuss pain control options.  Monitoring Your health care provider may monitor your contractions (uterine monitoring) and your baby's heart rate (fetal monitoring). You may need to be monitored:  Often, but not continuously (intermittently).  All the time or for long periods at a time (continuously). Continuous monitoring may be needed if: ? You are taking certain medicines, such as medicine to relieve pain or make your contractions stronger. ? You have pregnancy or labor complications.  Monitoring may be done by:  Placing a special stethoscope or a handheld monitoring device on your abdomen to   check your baby's heartbeat, and feeling your abdomen for contractions. This method of monitoring does not continuously record your baby's heartbeat or your contractions.  Placing monitors on your abdomen (external monitors) to record your baby's heartbeat and the frequency and length of contractions. You may not have to wear external monitors all the time.  Placing monitors inside of your uterus  (internal monitors) to record your baby's heartbeat and the frequency, length, and strength of your contractions. ? Your health care provider may use internal monitors if he or she needs more information about the strength of your contractions or your baby's heart rate. ? Internal monitors are put in place by passing a thin, flexible wire through your vagina and into your uterus. Depending on the type of monitor, it may remain in your uterus or on your baby's head until birth. ? Your health care provider will discuss the benefits and risks of internal monitoring with you and will ask for your permission before inserting the monitors.  Telemetry. This is a type of continuous monitoring that can be done with external or internal monitors. Instead of having to stay in bed, you are able to move around during telemetry. Ask your health care provider if telemetry is an option for you.  Physical exam Your health care provider may perform a physical exam. This may include:  Checking whether your baby is positioned: ? With the head toward your vagina (head-down). This is most common. ? With the head toward the top of your uterus (head-up or breech). If your baby is in a breech position, your health care provider may try to turn your baby to a head-down position so you can deliver vaginally. If it does not seem that your baby can be born vaginally, your provider may recommend surgery to deliver your baby. In rare cases, you may be able to deliver vaginally if your baby is head-up (breech delivery). ? Lying sideways (transverse). Babies that are lying sideways cannot be delivered vaginally.  Checking your cervix to determine: ? Whether it is thinning out (effacing). ? Whether it is opening up (dilating). ? How low your baby has moved into your birth canal.  What are the three stages of labor and delivery?  Normal labor and delivery is divided into the following three stages: Stage 1  Stage 1 is the  longest stage of labor, and it can last for hours or days. Stage 1 includes: ? Early labor. This is when contractions may be irregular, or regular and mild. Generally, early labor contractions are more than 10 minutes apart. ? Active labor. This is when contractions get longer, more regular, more frequent, and more intense. ? The transition phase. This is when contractions happen very close together, are very intense, and may last longer than during any other part of labor.  Contractions generally feel mild, infrequent, and irregular at first. They get stronger, more frequent (about every 2-3 minutes), and more regular as you progress from early labor through active labor and transition.  Many women progress through stage 1 naturally, but you may need help to continue making progress. If this happens, your health care provider may talk with you about: ? Rupturing your amniotic sac if it has not ruptured yet. ? Giving you medicine to help make your contractions stronger and more frequent.  Stage 1 ends when your cervix is completely dilated to 4 inches (10 cm) and completely effaced. This happens at the end of the transition phase. Stage 2  Once   your cervix is completely effaced and dilated to 4 inches (10 cm), you may start to feel an urge to push. It is common for the body to naturally take a rest before feeling the urge to push, especially if you received an epidural or certain other pain medicines. This rest period may last for up to 1-2 hours, depending on your unique labor experience.  During stage 2, contractions are generally less painful, because pushing helps relieve contraction pain. Instead of contraction pain, you may feel stretching and burning pain, especially when the widest part of your baby's head passes through the vaginal opening (crowning).  Your health care provider will closely monitor your pushing progress and your baby's progress through the vagina during stage 2.  Your  health care provider may massage the area of skin between your vaginal opening and anus (perineum) or apply warm compresses to your perineum. This helps it stretch as the baby's head starts to crown, which can help prevent perineal tearing. ? In some cases, an incision may be made in your perineum (episiotomy) to allow the baby to pass through the vaginal opening. An episiotomy helps to make the opening of the vagina larger to allow more room for the baby to fit through.  It is very important to breathe and focus so your health care provider can control the delivery of your baby's head. Your health care provider may have you decrease the intensity of your pushing, to help prevent perineal tearing.  After delivery of your baby's head, the shoulders and the rest of the body generally deliver very quickly and without difficulty.  Once your baby is delivered, the umbilical cord may be cut right away, or this may be delayed for 1-2 minutes, depending on your baby's health. This may vary among health care providers, hospitals, and birth centers.  If you and your baby are healthy enough, your baby may be placed on your chest or abdomen to help maintain the baby's temperature and to help you bond with each other. Some mothers and babies start breastfeeding at this time. Your health care team will dry your baby and help keep your baby warm during this time.  Your baby may need immediate care if he or she: ? Showed signs of distress during labor. ? Has a medical condition. ? Was born too early (prematurely). ? Had a bowel movement before birth (meconium). ? Shows signs of difficulty transitioning from being inside the uterus to being outside of the uterus. If you are planning to breastfeed, your health care team will help you begin a feeding. Stage 3  The third stage of labor starts immediately after the birth of your baby and ends after you deliver the placenta. The placenta is an organ that develops  during pregnancy to provide oxygen and nutrients to your baby in the womb.  Delivering the placenta may require some pushing, and you may have mild contractions. Breastfeeding can stimulate contractions to help you deliver the placenta.  After the placenta is delivered, your uterus should tighten (contract) and become firm. This helps to stop bleeding in your uterus. To help your uterus contract and to control bleeding, your health care provider may: ? Give you medicine by injection, through an IV tube, by mouth, or through your rectum (rectally). ? Massage your abdomen or perform a vaginal exam to remove any blood clots that are left in your uterus. ? Empty your bladder by placing a thin, flexible tube (catheter) into your bladder. ? Encourage   you to breastfeed your baby. After labor is over, you and your baby will be monitored closely to ensure that you are both healthy until you are ready to go home. Your health care team will teach you how to care for yourself and your baby. This information is not intended to replace advice given to you by your health care provider. Make sure you discuss any questions you have with your health care provider. Document Released: 01/08/2008 Document Revised: 10/19/2015 Document Reviewed: 04/15/2015 Elsevier Interactive Patient Education  2018 [employer] Third Trimester of Pregnancy The third trimester is from week 29 through week 42, months 7 through 9. This trimester is when your unborn baby (fetus) is growing very fast. At the end of the ninth month, the unborn baby is about 20 inches in length. It weighs about 6-10 pounds. Follow these instructions at home:  Avoid all smoking, herbs, and alcohol. Avoid drugs not approved by your doctor.  Do not use any tobacco products, including cigarettes, chewing tobacco, and electronic cigarettes. If you need help quitting, ask your doctor. You may get counseling or other support to help you quit.  Only take  medicine as told by your doctor. Some medicines are safe and some are not during pregnancy.  Exercise only as told by your doctor. Stop exercising if you start having cramps.  Eat regular, healthy meals.  Wear a good support bra if your breasts are tender.  Do not use hot tubs, steam rooms, or saunas.  Wear your seat belt when driving.  Avoid raw meat, uncooked cheese, and liter boxes and soil used by cats.  Take your prenatal vitamins.  Take 1500-2000 milligrams of calcium daily starting at the 20th week of pregnancy until you deliver your baby.  Try taking medicine that helps you poop (stool softener) as needed, and if your doctor approves. Eat more fiber by eating fresh fruit, vegetables, and whole grains. Drink enough fluids to keep your pee (urine) clear or pale yellow.  Take warm water baths (sitz baths) to soothe pain or discomfort caused by hemorrhoids. Use hemorrhoid cream if your doctor approves.  If you have puffy, bulging veins (varicose veins), wear support hose. Raise (elevate) your feet for 15 minutes, 3-4 times a day. Limit salt in your diet.  Avoid heavy lifting, wear low heels, and sit up straight.  Rest with your legs raised if you have leg cramps or low back pain.  Visit your dentist if you have not gone during your pregnancy. Use a soft toothbrush to brush your teeth. Be gentle when you floss.  You can have sex (intercourse) unless your doctor tells you not to.  Do not travel far distances unless you must. Only do so with your doctor's approval.  Take prenatal classes.  Practice driving to the hospital.  Pack your hospital bag.  Prepare the baby's room.  Go to your doctor visits. Get help if:  You are not sure if you are in labor or if your water has broken.  You are dizzy.  You have mild cramps or pressure in your lower belly (abdominal).  You have a nagging pain in your belly area.  You continue to feel sick to your stomach (nauseous), throw  up (vomit), or have watery poop (diarrhea).  You have bad smelling fluid coming from your vagina.  You have pain with peeing (urination). Get help right away if:  You have a fever.  You are leaking fluid from your vagina.  You are spotting  or bleeding from your vagina.  You have severe belly cramping or pain.  You lose or gain weight rapidly.  You have trouble catching your breath and have chest pain.  You notice sudden or extreme puffiness (swelling) of your face, hands, ankles, feet, or legs.  You have not felt the baby move in over an hour.  You have severe headaches that do not go away with medicine.  You have vision changes. This information is not intended to replace advice given to you by your health care provider. Make sure you discuss any questions you have with your health care provider. Document Released: 06/25/2009 Document Revised: 09/06/2015 Document Reviewed: 06/01/2012 Elsevier Interactive Patient Education  2017 [employer].

## 2018-03-24 ENCOUNTER — Ambulatory Visit (INDEPENDENT_AMBULATORY_CARE_PROVIDER_SITE_OTHER): Payer: BLUE CROSS/BLUE SHIELD | Admitting: Obstetrics and Gynecology

## 2018-03-24 VITALS — BP 127/78 | HR 84 | Wt 205.0 lb

## 2018-03-24 DIAGNOSIS — Z3493 Encounter for supervision of normal pregnancy, unspecified, third trimester: Secondary | ICD-10-CM | POA: Diagnosis not present

## 2018-03-24 LAB — POCT URINALYSIS DIPSTICK OB
Bilirubin, UA: NEGATIVE
Blood, UA: NEGATIVE
Glucose, UA: NEGATIVE
Ketones, UA: NEGATIVE
Leukocytes, UA: NEGATIVE
Nitrite, UA: NEGATIVE
POC,PROTEIN,UA: NEGATIVE
Spec Grav, UA: 1.015 (ref 1.010–1.025)
Urobilinogen, UA: 0.2 E.U./dL
pH, UA: 6 (ref 5.0–8.0)

## 2018-03-24 NOTE — Progress Notes (Signed)
ROB- pt is having some pelvic pressure 

## 2018-03-24 NOTE — Progress Notes (Signed)
ROB-IOL form sent in and schedule is full next week, L&D will call to add her on by end of week. Discussed risks of failed induction and patient states clear understanding that if contractions do not happen and all is well she will be sent home and we would not proceed with cesarean delivery.

## 2018-03-30 ENCOUNTER — Inpatient Hospital Stay
Admission: EM | Admit: 2018-03-30 | Discharge: 2018-04-02 | DRG: 806 | Disposition: A | Discharge status: Home / Self Care | Payer: BLUE CROSS/BLUE SHIELD | Attending: Obstetrics and Gynecology | Admitting: Obstetrics and Gynecology

## 2018-03-30 ENCOUNTER — Other Ambulatory Visit: Payer: Self-pay

## 2018-03-30 DIAGNOSIS — Z3A39 39 weeks gestation of pregnancy: Secondary | ICD-10-CM

## 2018-03-30 DIAGNOSIS — R7309 Other abnormal glucose: Secondary | ICD-10-CM

## 2018-03-30 DIAGNOSIS — O99824 Streptococcus B carrier state complicating childbirth: Secondary | ICD-10-CM | POA: Diagnosis not present

## 2018-03-30 DIAGNOSIS — O9832 Other infections with a predominantly sexual mode of transmission complicating childbirth: Secondary | ICD-10-CM | POA: Diagnosis not present

## 2018-03-30 DIAGNOSIS — B951 Streptococcus, group B, as the cause of diseases classified elsewhere: Secondary | ICD-10-CM

## 2018-03-30 DIAGNOSIS — A6000 Herpesviral infection of urogenital system, unspecified: Secondary | ICD-10-CM | POA: Diagnosis present

## 2018-03-30 DIAGNOSIS — O2603 Excessive weight gain in pregnancy, third trimester: Secondary | ICD-10-CM | POA: Diagnosis not present

## 2018-03-30 DIAGNOSIS — Z3490 Encounter for supervision of normal pregnancy, unspecified, unspecified trimester: Secondary | ICD-10-CM | POA: Diagnosis present

## 2018-03-30 DIAGNOSIS — O26893 Other specified pregnancy related conditions, third trimester: Secondary | ICD-10-CM | POA: Diagnosis not present

## 2018-03-30 LAB — CBC
HCT: 37.8 % (ref 36.0–46.0)
Hemoglobin: 12.6 g/dL (ref 12.0–15.0)
MCH: 30.5 pg (ref 26.0–34.0)
MCHC: 33.3 g/dL (ref 30.0–36.0)
MCV: 91.5 fL (ref 80.0–100.0)
Platelets: 277 10*3/uL (ref 150–400)
RBC: 4.13 MIL/uL (ref 3.87–5.11)
RDW: 13.5 % (ref 11.5–15.5)
WBC: 12.1 10*3/uL — ABNORMAL HIGH (ref 4.0–10.5)
nRBC: 0 % (ref 0.0–0.2)

## 2018-03-30 LAB — TYPE AND SCREEN
ABO/RH(D): A POS
Antibody Screen: NEGATIVE

## 2018-03-30 MED ORDER — LIDOCAINE HCL (PF) 1 % IJ SOLN: 30.0000 mL | INTRAMUSCULAR | Status: DC | PRN

## 2018-03-30 MED ORDER — ONDANSETRON HCL 4 MG/2ML IJ SOLN: 4.0000 mg | Freq: Four times a day (QID) | INTRAMUSCULAR | Status: DC | PRN

## 2018-03-30 MED ORDER — MISOPROSTOL 200 MCG PO TABS
ORAL_TABLET | ORAL | Status: AC
  Administered 2018-03-30: 50 ug via VAGINAL
  Filled 2018-03-30: qty 4

## 2018-03-30 MED ORDER — OXYTOCIN 40 UNITS IN LACTATED RINGERS INFUSION - SIMPLE MED: 2.5000 [IU]/h | INTRAVENOUS | Status: DC

## 2018-03-30 MED ORDER — BUTORPHANOL TARTRATE 1 MG/ML IJ SOLN: 1.0000 mg | INTRAMUSCULAR | Status: DC | PRN

## 2018-03-30 MED ORDER — OXYTOCIN 10 UNIT/ML IJ SOLN
INTRAMUSCULAR | Status: AC
  Filled 2018-03-30: qty 2

## 2018-03-30 MED ORDER — OXYTOCIN BOLUS FROM INFUSION: 500.0000 mL | Freq: Once | INTRAVENOUS | Status: DC

## 2018-03-30 MED ORDER — LACTATED RINGERS IV SOLN: 500.0000 mL | INTRAVENOUS | Status: DC | PRN

## 2018-03-30 MED ORDER — MISOPROSTOL 25 MCG QUARTER TABLET
50.0000 ug | ORAL_TABLET | ORAL | Status: DC
  Administered 2018-03-30 (×3): 50 ug via VAGINAL
  Filled 2018-03-30 (×3): qty 1

## 2018-03-30 MED ORDER — SOD CITRATE-CITRIC ACID 500-334 MG/5ML PO SOLN: 30.0000 mL | ORAL | Status: DC | PRN

## 2018-03-30 MED ORDER — TERBUTALINE SULFATE 1 MG/ML IJ SOLN: 0.2500 mg | Freq: Once | INTRAMUSCULAR | Status: DC | PRN

## 2018-03-30 MED ORDER — PENICILLIN G 3 MILLION UNITS IVPB - SIMPLE MED
3.0000 10*6.[IU] | INTRAVENOUS | Status: DC
  Administered 2018-03-30 – 2018-03-31 (×3): 3 10*6.[IU] via INTRAVENOUS
  Filled 2018-03-30 (×5): qty 100
  Filled 2018-03-30: qty 3
  Filled 2018-03-30 (×9): qty 100

## 2018-03-30 MED ORDER — SODIUM CHLORIDE 0.9 % IV SOLN
5.0000 10*6.[IU] | Freq: Once | INTRAVENOUS | Status: AC
  Administered 2018-03-30: 5 10*6.[IU] via INTRAVENOUS
  Filled 2018-03-30: qty 5

## 2018-03-30 MED ORDER — LACTATED RINGERS IV SOLN
INTRAVENOUS | Status: DC
  Administered 2018-03-30 – 2018-03-31 (×3): via INTRAVENOUS

## 2018-03-30 MED ORDER — OXYCODONE-ACETAMINOPHEN 5-325 MG PO TABS: 2.0000 | ORAL_TABLET | ORAL | Status: DC | PRN

## 2018-03-30 MED ORDER — AMMONIA AROMATIC IN INHA
RESPIRATORY_TRACT | Status: AC
  Filled 2018-03-30: qty 10

## 2018-03-30 MED ORDER — OXYCODONE-ACETAMINOPHEN 5-325 MG PO TABS: 1.0000 | ORAL_TABLET | ORAL | Status: DC | PRN

## 2018-03-30 MED ORDER — ACETAMINOPHEN 325 MG PO TABS
650.0000 mg | ORAL_TABLET | ORAL | Status: DC | PRN
  Filled 2018-03-30: qty 2

## 2018-03-30 MED ORDER — OXYTOCIN 40 UNITS IN LACTATED RINGERS INFUSION - SIMPLE MED
1.0000 m[IU]/min | INTRAVENOUS | Status: DC
  Administered 2018-03-31: 2 m[IU]/min via INTRAVENOUS
  Filled 2018-03-30: qty 1000

## 2018-03-30 MED ORDER — LIDOCAINE HCL (PF) 1 % IJ SOLN
INTRAMUSCULAR | Status: AC
  Filled 2018-03-30: qty 30

## 2018-03-30 NOTE — Progress Notes (Signed)
Patient ID: Cassandra Stephens, female   DOB: Oct 13, 1990, 27 y.o.   MRN: [id number]  Called in room to assess vaginal spotting after foley bulb placement. FHR Category I. Contractions present with soft resting tone. SVE: foley bulb in place, loose 1 cm. To place pad and continue to monitor symptoms. Reassess as needed.    Gunnar BullaJenkins Michelle Lawhorn, CNM [facility], [facility] 03/30/18 6:31 PM

## 2018-03-30 NOTE — Progress Notes (Signed)
Patient ID: Georgeanna HarrisonHannah N Cincotta, female   DOB: [birth year], 27 y.o.   MRN: [id number]  Georgeanna HarrisonHannah N Mogensen is a 27 y.o. G1P0000 at 5963w3d by ultrasound admitted for induction of labor due to Elective at term.  Subjective:  Patient reports intermittent back pain and occasional contractions. Endorses good fetal movement. Questions spontaneous rupture of membranes this morning before arriving to the hospital.   Denies difficulty breathing or respiratory distress, chest pain, vaginal bleeding, dysuria, and leg pain or swelling.   Objective:  Temp:  [97.5 F (36.4 C)-98.1 F (36.7 C)] 98.1 F (36.7 C) (12/17 1615) Pulse Rate:  [81] 81 (12/17 1615) Resp:  [14-16] 16 (12/17 1615) BP: (121-123)/(73-76) 123/73 (12/17 1615) Weight:  [93 kg] 93 kg (12/17 0900)  Fetal Wellbeing:  Category I  UC:   regular, every two (2) to four (4) minutes; soft resting tone  SVE:   Dilation: 1 Effacement (%): Thick Station: -3 Exam by:: [employer]Johnson RN    Positive pooling  Labs: Lab Results  Component Value Date   WBC 12.1 (H) 03/30/2018   HGB 12.6 03/30/2018   HCT 37.8 03/30/2018   MCV 91.5 03/30/2018   PLT 277 03/30/2018    Assessment:  Georgeanna HarrisonHannah N Malerba is a 27 y.o. G1P0000 at 5963w3d being admitted for elective induction of labor, Rh positive, HSV positive-no lesions, GBS positive, SROM clear, foley bulb  FHR Category I  Plan:  Verbal consent obtained for foley bulb placement. The patient was placed in the dorsal lithotomy position.  A speculum was inserted into the vagina and the cervix was visualized. A foley catheter was advanced into the cervix slightly pass the internal cervical os. The foley balloon was filled with 30 ml of normal saline. Gentle traction was placed on the foley bulb, and the catheter was taped to the medial portion of the thigh. The patient tolerated the procedure well.   Positive pooling noted on vaginal exam. Will start GBS prophylaxis now, see orders.   Encouraged position change  and use of peanut ball.   Reviewed red flag symptoms and when to call.   Continue orders as written. Reassess as needed.    Gunnar BullaJenkins Michelle Arla Boutwell, CNM [facility], [facility] 03/30/2018, 5:40 PM

## 2018-03-30 NOTE — H&P (Signed)
Obstetric History and Physical  Cassandra Stephens is a 27 y.o. G1P0000 with IUP at [redacted]w[redacted]d presenting for elective induction of labor due to spousal work schedule.   Patient states she has been having irregular contractions, none vaginal bleeding, intact membranes, with active fetal movement.    Denies difficulty breathing or respiratory distress, chest pain, abdominal pain, dysuria, and leg pain or swelling.   Prenatal Course  Source of Care: [facility]-initial visit at 12 wks, total visits:   Pregnancy complications or risks: Excessive weight gain in early pregnancy managed with referral to medical nutrition therapy, Back pain pregnancy, GBS positive, HSV positive on prophylaxis, History of anxiety and depression  Prenatal labs and studies:  ABO, Rh: A/Positive/-- 11-02-22 1631)  Antibody: Negative 11/02/2022 1631)  Rubella: 1.45 2022/11/02 1631)  Varicella: 2,055 Nov 02, 2022 1631)  RPR: Non Reactive (10/03 0918)   HBsAg: Negative 11/02/2022 1631)   HIV: Non Reactive 11/02/22 1631)   UJW:JXBJYNWG (11/26 1001)  1 hr Glucola: 168 (10/03 0918)  Glucose Tolerance Test: 71, 243, 152, 66 (10/10 0838)  Genetic screening: Low risk female 2022-11-02 1631)  Anatomy US: Complete, normal (08/07 1623)  Past Medical History:  Diagnosis Date  . Anxiety   . Depression   . Herpes simplex virus (HSV) type I or type II DNA not detected by PCR     Past Surgical History:  Procedure Laterality Date  . TONSILECTOMY, ADENOIDECTOMY, BILATERAL MYRINGOTOMY AND TUBES    . TONSILLECTOMY Bilateral     OB History  Gravida Para Term Preterm AB Living  1 0 0 0 0 0  SAB TAB Ectopic Multiple Live Births  0 0 0 0 0    # Outcome Date GA Lbr Len/2nd Weight Sex Delivery Anes PTL Lv  1 Current             Social History   Socioeconomic History  . Marital status: Married    Spouse name: Not on file  . Number of children: Not on file  . Years of education: Not on file  . Highest education level: Not on file  Occupational  History  . Not on file  Social Needs  . Financial resource strain: Not hard at all  . Food insecurity:    Worry: Never true    Inability: Never true  . Transportation needs:    Medical: No    Non-medical: No  Tobacco Use  . Smoking status: Never Smoker  . Smokeless tobacco: Never Used  Substance and Sexual Activity  . Alcohol use: Never    Frequency: Never  . Drug use: Never  . Sexual activity: Yes    Birth control/protection: None  Lifestyle  . Physical activity:    Days per week: 0 days    Minutes per session: 0 min  . Stress: Not at all  Relationships  . Social connections:    Talks on phone: More than three times a week    Gets together: More than three times a week    Attends religious service: Never    Active member of club or organization: No    Attends meetings of clubs or organizations: Never    Relationship status: Married  Other Topics Concern  . Not on file  Social History Narrative  . Not on file    Family History  Problem Relation Age of Onset  . Hyperlipidemia Father   . Hypertension Father   . Heart attack Father   . Diabetes Maternal Grandfather   . Diabetes Paternal  Grandfather   . Heart disease Paternal Grandfather     Medications Prior to Admission  Medication Sig Dispense Refill Last Dose  . Prenatal Vit-Fe Fumarate-FA (PRENATAL MULTIVITAMIN) TABS tablet Take 1 tablet by mouth daily at 12 noon.   03/30/2018 at Unknown time  . valACYclovir (VALTREX) 1000 MG tablet Take 1 tablet (1,000 mg total) by mouth daily. 30 tablet 2 03/30/2018 at Unknown time    No Known Allergies  Review of Systems: Negative except for what is mentioned in HPI.  Physical Exam:  BP 121/76 (BP Location: Left Arm)   Pulse 81   Temp (!) 97.5 F (36.4 C) (Oral)   Resp 16   Ht 5\' 4"  (1.626 m)   Wt 93 kg   LMP  (LMP Unknown)   BMI 35.19 kg/m   GENERAL: Well-developed, well-nourished female in no acute distress.   LUNGS: Clear to auscultation bilaterally.    HEART: Regular rate and rhythm.  ABDOMEN: Soft, nontender, nondistended, gravid.  EXTREMITIES: Nontender, no edema, 2+ distal pulses.  PELVIC EXAM: No lesions present  Cervical Exam: Dilation: 1 Effacement (%): Thick Station: -3 Exam by:: Cassandra Stephens CNM  FHT:  Baseline rate 130 bpm   Variability moderate  Accelerations present   Decelerations none  Contractions: Occasional contractions, soft resting tone   Pertinent Labs/Studies:    No results found for this or any previous visit (from the past 24 hour(s)).  Assessment :  Cassandra Stephens is a 27 y.o. G1P0000 at 4219w3d being admitted for elective induction of labor, Rh positive, HSV positive-no lesions, GBS positive  FHR Category I  Plan:  Admit to birth suites for induction of labor, see orders.   Discussed options for induction of labor as well as associated risks and benefits. Will proceed with vaginal cytotec.   Reviewed red flag symptoms and when to call.   Family members at bedside for continuous labor support.   Delivery plan: Hopeful for vaginal delivery.   Dr. Logan BoresEvans notified of admission and plan of care.    Gunnar BullaJenkins Michelle Lashunda Greis, CNM [facility], [facility] 03/30/18 9:00 AM

## 2018-03-30 NOTE — Progress Notes (Signed)
Patient ID: Georgeanna HarrisonHannah N Scarboro, female   DOB: Mar 04, 1991, 27 y.o.   MRN: [id number]  Georgeanna HarrisonHannah N Ivan is a 27 y.o. G1P0000 at 847w3d by ultrasound admitted for induction of labor due to Elective at term.  Subjective:  Patient sitting in bed, reports increased contractions frequency and intensity. Family members at bedside for continuous labor support.   Denies difficulty breathing or respiratory distress, chest pain, vaginal bleeding, dysuria, and leg pain or swelling.   Objective:  Temp:  [97.5 F (36.4 C)-98.4 F (36.9 C)] 98.4 F (36.9 C) (12/17 1948) Pulse Rate:  [81-91] 91 (12/17 1948) Resp:  [14-18] 18 (12/17 1948) BP: (121-131)/(73-77) 131/77 (12/17 1948) SpO2:  [96 %-99 %] 96 % (12/17 1820) Weight:  [93 kg] 93 kg (12/17 0900)  Fetal Wellbeing:  Category I  UC:   regular, every one (1) to five (5) minutes; soft resting tone  SVE:   Dilation: 4 Effacement (%): 60 Station: -2 Exam by:: Willodean RosenthalM. Veronica Fretz, CNM  Labs: Lab Results  Component Value Date   WBC 12.1 (H) 03/30/2018   HGB 12.6 03/30/2018   HCT 37.8 03/30/2018   MCV 91.5 03/30/2018   PLT 277 03/30/2018    Assessment:  Evlyn CourierHannah N Ludwigis a 27 y.o.G1P0000 at 5247w3d being admitted for elective induction of labor, Rh positive, HSV positive-no lesions, GBS positive, SROM clear  FHR Category I  Plan:  Foley bulb removed with gentle traction.   Encouraged position change and use of peanut ball.   May have epidural for pain management when desired.   Continue orders as written. Reassess as needed.    Gunnar BullaJenkins Michelle Ermagene Saidi, CNM [facility], [facility] 03/30/2018, 11:38 PM

## 2018-03-31 ENCOUNTER — Encounter: Payer: BLUE CROSS/BLUE SHIELD | Admitting: Certified Nurse Midwife

## 2018-03-31 ENCOUNTER — Inpatient Hospital Stay: Payer: BLUE CROSS/BLUE SHIELD | Admitting: Anesthesiology

## 2018-03-31 DIAGNOSIS — Z3A39 39 weeks gestation of pregnancy: Secondary | ICD-10-CM

## 2018-03-31 DIAGNOSIS — O99824 Streptococcus B carrier state complicating childbirth: Principal | ICD-10-CM

## 2018-03-31 DIAGNOSIS — O2603 Excessive weight gain in pregnancy, third trimester: Secondary | ICD-10-CM

## 2018-03-31 LAB — RPR: RPR Ser Ql: NONREACTIVE

## 2018-03-31 MED ORDER — EPHEDRINE 5 MG/ML INJ
10.0000 mg | INTRAVENOUS | Status: DC | PRN
  Filled 2018-03-31: qty 2

## 2018-03-31 MED ORDER — IBUPROFEN 600 MG PO TABS
ORAL_TABLET | ORAL | Status: AC
  Filled 2018-03-31: qty 1

## 2018-03-31 MED ORDER — LIDOCAINE-EPINEPHRINE (PF) 1.5 %-1:200000 IJ SOLN
INTRAMUSCULAR | Status: DC | PRN
  Administered 2018-03-31: 3 mL via EPIDURAL

## 2018-03-31 MED ORDER — BENZOCAINE-MENTHOL 20-0.5 % EX AERO
INHALATION_SPRAY | CUTANEOUS | Status: AC
  Filled 2018-03-31: qty 56

## 2018-03-31 MED ORDER — LIDOCAINE HCL (PF) 1 % IJ SOLN
INTRAMUSCULAR | Status: DC | PRN
  Administered 2018-03-31: 1.2 mL

## 2018-03-31 MED ORDER — OXYTOCIN 10 UNIT/ML IJ SOLN
10.0000 [IU] | Freq: Once | INTRAMUSCULAR | Status: AC
  Administered 2018-03-31: 10 [IU] via INTRAMUSCULAR

## 2018-03-31 MED ORDER — FENTANYL 2.5 MCG/ML W/ROPIVACAINE 0.15% IN NS 100 ML EPIDURAL (ARMC)
EPIDURAL | Status: DC | PRN
  Administered 2018-03-31: 12 mL/h via EPIDURAL

## 2018-03-31 MED ORDER — DOCUSATE SODIUM 100 MG PO CAPS
100.0000 mg | ORAL_CAPSULE | Freq: Two times a day (BID) | ORAL | Status: DC
  Administered 2018-03-31 – 2018-04-02 (×4): 100 mg via ORAL
  Filled 2018-03-31 (×4): qty 1

## 2018-03-31 MED ORDER — DIBUCAINE 1 % RE OINT: 1.0000 "application " | TOPICAL_OINTMENT | RECTAL | Status: DC | PRN

## 2018-03-31 MED ORDER — PHENYLEPHRINE 40 MCG/ML (10ML) SYRINGE FOR IV PUSH (FOR BLOOD PRESSURE SUPPORT)
80.0000 ug | PREFILLED_SYRINGE | INTRAVENOUS | Status: DC | PRN
  Filled 2018-03-31: qty 10

## 2018-03-31 MED ORDER — ONDANSETRON HCL 4 MG PO TABS: 4.0000 mg | ORAL_TABLET | ORAL | Status: DC | PRN

## 2018-03-31 MED ORDER — LACTATED RINGERS IV SOLN: 500.0000 mL | Freq: Once | INTRAVENOUS | Status: DC

## 2018-03-31 MED ORDER — ACETAMINOPHEN 325 MG PO TABS
650.0000 mg | ORAL_TABLET | ORAL | Status: DC | PRN
  Administered 2018-03-31 – 2018-04-02 (×7): 650 mg via ORAL
  Filled 2018-03-31 (×6): qty 2

## 2018-03-31 MED ORDER — WITCH HAZEL-GLYCERIN EX PADS: 1.0000 "application " | MEDICATED_PAD | CUTANEOUS | Status: DC | PRN

## 2018-03-31 MED ORDER — BENZOCAINE-MENTHOL 20-0.5 % EX AERO
1.0000 "application " | INHALATION_SPRAY | CUTANEOUS | Status: DC | PRN
  Administered 2018-03-31 – 2018-04-01 (×2): 1 via TOPICAL
  Filled 2018-03-31: qty 56

## 2018-03-31 MED ORDER — IBUPROFEN 600 MG PO TABS
600.0000 mg | ORAL_TABLET | Freq: Four times a day (QID) | ORAL | Status: DC
  Administered 2018-03-31 – 2018-04-02 (×9): 600 mg via ORAL
  Filled 2018-03-31 (×7): qty 1

## 2018-03-31 MED ORDER — DIPHENHYDRAMINE HCL 50 MG/ML IJ SOLN: 12.5000 mg | INTRAMUSCULAR | Status: DC | PRN

## 2018-03-31 MED ORDER — SIMETHICONE 80 MG PO CHEW: 80.0000 mg | CHEWABLE_TABLET | ORAL | Status: DC | PRN

## 2018-03-31 MED ORDER — ONDANSETRON HCL 4 MG/2ML IJ SOLN: 4.0000 mg | INTRAMUSCULAR | Status: DC | PRN

## 2018-03-31 MED ORDER — COCONUT OIL OIL
1.0000 "application " | TOPICAL_OIL | Status: DC | PRN
  Administered 2018-04-01: 1 via TOPICAL
  Filled 2018-03-31: qty 120

## 2018-03-31 MED ORDER — FENTANYL 2.5 MCG/ML W/ROPIVACAINE 0.15% IN NS 100 ML EPIDURAL (ARMC)
EPIDURAL | Status: AC
  Filled 2018-03-31: qty 100

## 2018-03-31 MED ORDER — FENTANYL 2.5 MCG/ML W/ROPIVACAINE 0.15% IN NS 100 ML EPIDURAL (ARMC)
12.0000 mL/h | EPIDURAL | Status: DC
  Administered 2018-03-31: 12 mL/h via EPIDURAL
  Filled 2018-03-31: qty 100

## 2018-03-31 MED ORDER — VALACYCLOVIR HCL 500 MG PO TABS
1000.0000 mg | ORAL_TABLET | Freq: Every day | ORAL | Status: DC
  Administered 2018-03-31 – 2018-04-02 (×3): 1000 mg via ORAL
  Filled 2018-03-31 (×3): qty 2

## 2018-03-31 MED ORDER — DIPHENHYDRAMINE HCL 25 MG PO CAPS: 25.0000 mg | ORAL_CAPSULE | Freq: Four times a day (QID) | ORAL | Status: DC | PRN

## 2018-03-31 MED ORDER — PRENATAL MULTIVITAMIN CH
1.0000 | ORAL_TABLET | Freq: Every day | ORAL | Status: DC
  Administered 2018-04-01: 1 via ORAL
  Filled 2018-03-31: qty 1

## 2018-03-31 NOTE — Anesthesia Procedure Notes (Signed)
Epidural Patient location during procedure: OB Start time: 03/31/2018 12:17 AM End time: 03/31/2018 12:39 AM  Staffing Performed: anesthesiologist   Preanesthetic Checklist Completed: patient identified, site marked, surgical consent, pre-op evaluation, timeout performed, IV checked, risks and benefits discussed and monitors and equipment checked  Epidural Patient position: sitting Prep: Betadine Patient monitoring: heart rate, continuous pulse ox and blood pressure Approach: midline Location: L4-L5 Injection technique: LOR saline  Needle:  Needle type: Tuohy  Needle gauge: 17 G Needle length: 9 cm and 9 Needle insertion depth: 7 cm Catheter type: closed end flexible Catheter size: 20 Guage Catheter at skin depth: 12 cm Test dose: negative and 1.5% lidocaine with Epi 1:200 K  Assessment Events: blood not aspirated, injection not painful, no injection resistance, negative IV test and no paresthesia  Additional Notes   Patient tolerated the insertion well without complications.Reason for block:procedure for pain

## 2018-03-31 NOTE — Progress Notes (Signed)
Georgeanna HarrisonHannah N Higbie is a 27 y.o. G1P0000 at 1520w4d by LMP admitted for induction of labor due to Elective at term.  Subjective:  Reports pressure with contractions. Epidural in place Objective: BP (!) 113/59   Pulse 95   Temp 98.4 F (36.9 C) (Oral)   Resp 18   Ht 5\' 4"  (1.626 m)   Wt 93 kg   LMP  (LMP Unknown)   SpO2 100%   BMI 35.19 kg/m  I/O last 3 completed shifts: In: 2788.3 [P.O.:365; I.V.:2323.3; IV Piggyback:100] Out: -  Total I/O In: -  Out: 1250 [Urine:1250]  FHT:  FHR: 131 bpm, variability: moderate,  accelerations:  Present,  decelerations:  Absent UC:   irregular, every 2-4 minutes, moderate to palpation SVE:   Dilation: 10 Effacement (%): 100 Station: Plus 1 Exam by:: Laural BenesB. Nielsen RN  Labs: Lab Results  Component Value Date   WBC 12.1 (H) 03/30/2018   HGB 12.6 03/30/2018   HCT 37.8 03/30/2018   MCV 91.5 03/30/2018   PLT 277 03/30/2018    Assessment / Plan: Induction of labor due to term with favorable cervix,  progressing well on pitocin, begin pushing  Labor: Progressing normally Preeclampsia:  labs stable Fetal Wellbeing:  Category I Pain Control:  Epidural I/D:  n/a Anticipated MOD:  NSVD  Mikle Sternberg N Rosezetta Balderston 03/31/2018, 10:10 AM

## 2018-03-31 NOTE — Anesthesia Preprocedure Evaluation (Signed)
Anesthesia Evaluation  Patient identified by MRN, date of birth, ID band Patient awake    Reviewed: Allergy & Precautions, NPO status , Patient's Chart, lab work & pertinent test results  History of Anesthesia Complications Negative for: history of anesthetic complications  Airway Mallampati: II       Dental   Pulmonary neg sleep apnea, neg COPD,           Cardiovascular (-) hypertension(-) Past MI and (-) CHF (-) dysrhythmias (-) Valvular Problems/Murmurs     Neuro/Psych neg Seizures Anxiety Depression    GI/Hepatic Neg liver ROS, GERD (with pregnancy)  ,  Endo/Other  neg diabetes  Renal/GU negative Renal ROS     Musculoskeletal   Abdominal   Peds  Hematology   Anesthesia Other Findings   Reproductive/Obstetrics (+) Pregnancy                             Anesthesia Physical Anesthesia Plan  ASA: II  Anesthesia Plan: Epidural   Post-op Pain Management:    Induction:   PONV Risk Score and Plan:   Airway Management Planned:   Additional Equipment:   Intra-op Plan:   Post-operative Plan:   Informed Consent: I have reviewed the patients History and Physical, chart, labs and discussed the procedure including the risks, benefits and alternatives for the proposed anesthesia with the patient or authorized representative who has indicated his/her understanding and acceptance.     Plan Discussed with:   Anesthesia Plan Comments:         Anesthesia Quick Evaluation

## 2018-03-31 NOTE — Lactation Note (Signed)
This note was copied from a baby's chart. Lactation Consultation Note  Patient Name: Cassandra Terri PiedraHannah Doddridge XBMWU'XToday's Date: 03/31/2018 Reason for consult: Initial assessment;1st time breastfeeding   Maternal Data Formula Feeding for Exclusion: No Has patient been taught Hand Expression?: Yes Does the patient have breastfeeding experience prior to this delivery?: No  Feeding Feeding Type: Breast Fed  LATCH Score Latch: Repeated attempts needed to sustain latch, nipple held in mouth throughout feeding, stimulation needed to elicit sucking reflex.  Audible Swallowing: A few with stimulation  Type of Nipple: Flat  Comfort (Breast/Nipple): Soft / non-tender  Hold (Positioning): Assistance needed to correctly position infant at breast and maintain latch.  LATCH Score: 6  Interventions Interventions: Breast feeding basics reviewed;Assisted with latch;Skin to skin;Hand express;Reverse pressure  Lactation Tools Discussed/Used Tools: Nipple Shields Nipple shield size: 20   Consult Status Consult Status: Follow-up Date: 03/31/18 Follow-up type: In-patient  MOB has flat nipples, LC is unsure if this is due to fluid after birth or if mom has flatter nipples. LC used reverse pressure, but nipple didn't come out much. LC also showed MOB how to hand express and lots of colostrum appeared. Baby fed very well from both breasts for approx. 10mins. Did better on rt side after 20mm nipple shield was implemented. Discussed latch, skin to skin, and physiology of breastfeeding. LC will f/u with dyad this afternoon.   Cassandra Stephens 03/31/2018, 12:39 PM

## 2018-03-31 NOTE — Progress Notes (Signed)
Patient ID: Cassandra Stephens, female   DOB: 1990/10/28, 27 y.o.   MRN: [id number]  Cassandra Stephens is a 27 y.o. G1P0000 at 7513w4d by ultrasound admitted for induction of labor due to Elective at term.  Subjective:  Patient resting quietly in bed on right side, reports relief of pain since epidural placement. Family members at bedside for continuous labor support.   Denies difficulty breathing or respiratory distress, chest pain, vaginal bleeding, dysuria, and leg pain or swelling.   Objective:  Temp:  [97.5 F (36.4 C)-98.4 F (36.9 C)] 98.4 F (36.9 C) (12/18 0658) Pulse Rate:  [78-98] 97 (12/18 0715) Resp:  [14-18] 18 (12/18 0658) BP: (90-134)/(49-85) 100/60 (12/18 0715) SpO2:  [96 %-100 %] 97 % (12/18 0720) Weight:  [93 kg] 93 kg (12/17 0900)  Fetal Wellbeing:  Category I  UC:   regular, every one (1) to four (4) minutes; soft resting tone; 16 mu/mins  SVE:   Dilation: 6.5 Effacement (%): 60 Station: 0 Exam by:: Neyda Durango CNM  Labs: Lab Results  Component Value Date   WBC 12.1 (H) 03/30/2018   HGB 12.6 03/30/2018   HCT 37.8 03/30/2018   MCV 91.5 03/30/2018   PLT 277 03/30/2018    Assessment:  Cassandra CourierHannah N Ludwigis a 27 y.o.G1P0000 at 8413w4d being admitted for elective induction of labor, Rh positive, HSV positive-no lesions, GBS positive, SROM clear  FHR Category I  Plan:  Encouraged position change and use of peanut ball.   Reviewed red flag symptoms and when to call.   Continue orders as written. Reassess as needed.    Gunnar BullaJenkins Michelle Bobby Barton, CNM [facility], [facility] 03/31/2018, 7:42 AM

## 2018-04-01 LAB — CBC
HCT: 27.2 % — ABNORMAL LOW (ref 36.0–46.0)
Hemoglobin: 8.9 g/dL — ABNORMAL LOW (ref 12.0–15.0)
MCH: 30.7 pg (ref 26.0–34.0)
MCHC: 32.7 g/dL (ref 30.0–36.0)
MCV: 93.8 fL (ref 80.0–100.0)
Platelets: 189 10*3/uL (ref 150–400)
RBC: 2.9 MIL/uL — ABNORMAL LOW (ref 3.87–5.11)
RDW: 13.5 % (ref 11.5–15.5)
WBC: 16.8 10*3/uL — ABNORMAL HIGH (ref 4.0–10.5)
nRBC: 0 % (ref 0.0–0.2)

## 2018-04-01 NOTE — Plan of Care (Signed)
Patient's vital signs stable; fundus firm; small amount rubra lochia; voiding; good appetite; good po fluids; breastfeeding with good technique observed; good maternal-infant bonding; pain controlled with po motrin and po tylenol; husband at bedside and attentive.

## 2018-04-01 NOTE — Progress Notes (Signed)
Post Partum Day 1 Subjective: no complaints and voiding  Objective: Blood pressure 107/86, pulse 69, temperature 98 F (36.7 C), temperature source Oral, resp. rate 20, height 5\' 4"  (1.626 m), weight 93 kg, SpO2 99 %, unknown if currently breastfeeding.  Physical Exam:  General: alert, cooperative, appears stated age and pale Lochia: appropriate Uterine Fundus: firm Incision: NA DVT Evaluation: No evidence of DVT seen on physical exam. Negative Homan's sign.  Recent Labs    03/30/18 0849 04/01/18 0527  HGB 12.6 8.9*  HCT 37.8 27.2*    Assessment/Plan: Plan for discharge tomorrow and Breastfeeding Infant feeding Breast    LOS: 2 days   Melody N Shambley 04/01/2018, 8:54 AM

## 2018-04-01 NOTE — Anesthesia Postprocedure Evaluation (Signed)
Anesthesia Post Note  Patient: Georgeanna HarrisonHannah N Szeliga  Procedure(s) Performed: AN AD HOC LABOR EPIDURAL  Patient location during evaluation: Mother Baby Anesthesia Type: Epidural Level of consciousness: awake and alert Pain management: pain level controlled Vital Signs Assessment: post-procedure vital signs reviewed and stable Respiratory status: spontaneous breathing, nonlabored ventilation and respiratory function stable Cardiovascular status: stable Postop Assessment: no headache, no backache and epidural receding Anesthetic complications: no     Last Vitals:  Vitals:   03/31/18 2334 04/01/18 0422  BP: 97/61 134/79  Pulse: 72 74  Resp: 20 20  Temp: 36.7 C 36.7 C  SpO2: 99% 99%    Last Pain:  Vitals:   04/01/18 0630  TempSrc:   PainSc: Asleep                 Rica MastBachich,  Ndia Sampath M

## 2018-04-01 NOTE — Lactation Note (Addendum)
This note was copied from a baby's chart. Lactation Consultation Note  Patient Name: Boy Terri PiedraHannah Abernathy ZOXWR'UToday's Date: 04/01/2018 Reason for consult: Follow-up assessment   Maternal Data    Feeding Feeding Type: Breast Fed  LATCH Score Latch: Repeated attempts needed to sustain latch, nipple held in mouth throughout feeding, stimulation needed to elicit sucking reflex.  Audible Swallowing: Spontaneous and intermittent  Type of Nipple: Everted at rest and after stimulation  Comfort (Breast/Nipple): Soft / non-tender  Hold (Positioning): Assistance needed to correctly position infant at breast and maintain latch.  LATCH Score: 8  Interventions Interventions: Assisted with latch;Support pillows;Adjust position  Lactation Tools Discussed/Used Tools: Nipple Shields Nipple shield size: 16   Consult Status Consult Status: PRN Date: 04/01/18 Follow-up type: In-patient LC assisted with latch and positioning of infant. Infant was placed on the right breast in the football position. Infant was able to latch after sandwiching the breast and intermittent  audible swallows are heard. Mother's left nipple is flatter than the right and infant cannot latch unless using the shield. Mother had a 20 mm shield but was given a 16 mm for better fit. Mother states that she will use the hand pump to help pull out nipple and attempt to latch without the shield and if infant is still having trouble, she will use the nipple shield. Mother was taught hand expression, frequency of wet and dirty diapers to expect and how to listen to audible swallows to assess milk transfer.   Arlyss Gandylicia Arilynn Blakeney 04/01/2018, 3:52 PM

## 2018-04-02 MED ORDER — DOCUSATE SODIUM 100 MG PO CAPS: 100.0000 mg | ORAL_CAPSULE | Freq: Two times a day (BID) | ORAL | 0 refills | Status: DC

## 2018-04-02 MED ORDER — IBUPROFEN 600 MG PO TABS: 600.0000 mg | ORAL_TABLET | Freq: Four times a day (QID) | ORAL | 0 refills | Status: DC

## 2018-04-02 NOTE — Final Progress Note (Signed)
Discharge Day SOAP Note:  Progress Note - Vaginal Delivery  Cassandra Stephens is a 27 y.o. G1P1001 now PP day 2 s/p Vaginal, Spontaneous . Delivery was uncomplicated  Subjective  The patient has the following complaints: has no unusual complaints  Pain is controlled with current medications.   Patient is urinating without difficulty.  She is ambulating well.     Objective  Vital signs: BP 112/71 (BP Location: Left Arm)   Pulse 65   Temp 98 F (36.7 C) (Oral)   Resp 18   Ht 5\' 4"  (1.626 m)   Wt 93 kg   LMP  (LMP Unknown)   SpO2 98%   Breastfeeding Unknown   BMI 35.19 kg/m   Physical Exam: Gen: NAD Fundus Fundal Tone: Firm  Lochia Amount: Small  Perineum Appearance: Intact     Data Review Labs: CBC Latest Ref Rng & Units 04/01/2018 03/30/2018 01/14/2018  WBC 4.0 - 10.5 K/uL 16.8(H) 12.1(H) 10.5  Hemoglobin 12.0 - 15.0 g/dL 1.6(X8.9(L) 09.612.6 04.511.2  Hematocrit 36.0 - 46.0 % 27.2(L) 37.8 33.5(L)  Platelets 150 - 400 K/uL 189 277 301   A POS  Assessment/Plan  Active Problems:   Group beta Strep positive   Encounter for elective induction of labor    Plan for discharge today.   Discharge Instructions: Per After Visit Summary. Activity: Advance as tolerated. Pelvic rest for 6 weeks.  Also refer to After Visit Summary Diet: Regular Medications: Allergies as of 04/02/2018   No Known Allergies     Medication List    STOP taking these medications   valACYclovir 1000 MG tablet Commonly known as:  VALTREX     TAKE these medications   docusate sodium 100 MG capsule Commonly known as:  COLACE Take 1 capsule (100 mg total) by mouth 2 (two) times daily.   ibuprofen 600 MG tablet Commonly known as:  ADVIL,MOTRIN Take 1 tablet (600 mg total) by mouth every 6 (six) hours.   prenatal multivitamin Tabs tablet Take 1 tablet by mouth daily at 12 noon.      Outpatient follow up: Harlow MaresMelody Shambley, CNM @ 6 wks Postpartum contraception: Will discuss at  first office visit post-partum, she is unsure at this time.   Discharged Condition: good  Discharged to: home  Newborn Data: Disposition:home with mother  Apgars: APGAR (1 MIN): 9   APGAR (5 MINS): 10   APGAR (10 MINS):    Baby Feeding: Breast    Doreene Burkennie Safiatou Islam, CNM  04/02/2018 8:00 AM

## 2018-04-02 NOTE — Discharge Summary (Signed)
                            Discharge Summary  Date of Admission: 03/30/2018  Date of Discharge: 04/02/2018  Admitting Diagnosis: Induction of labor at 6973w4d  Mode of Delivery: normal spontaneous vaginal delivery                 Discharge Diagnosis: No other diagnosis   Intrapartum Procedures: epidural   Post partum procedures: none  Complications: bilateral superficial perineal (no repear needed)                      Discharge Day SOAP Note:  Progress Note - Vaginal Delivery  Cassandra Stephens is a 27 y.o. G1P1001 now PP day 2 s/p Vaginal, Spontaneous . Delivery was uncomplicated  Subjective  The patient has the following complaints: has no unusual complaints  Pain is controlled with current medications.   Patient is urinating without difficulty.  She is ambulating well.     Objective  Vital signs: BP 112/71 (BP Location: Left Arm)   Pulse 65   Temp 98 F (36.7 C) (Oral)   Resp 18   Ht 5\' 4"  (1.626 m)   Wt 93 kg   LMP  (LMP Unknown)   SpO2 98%   Breastfeeding Unknown   BMI 35.19 kg/m   Physical Exam: Gen: NAD Fundus Fundal Tone: Firm  Lochia Amount: Small  Perineum Appearance: Intact     Data Review Labs: CBC Latest Ref Rng & Units 04/01/2018 03/30/2018 01/14/2018  WBC 4.0 - 10.5 K/uL 16.8(H) 12.1(H) 10.5  Hemoglobin 12.0 - 15.0 g/dL 8.2(N8.9(L) 56.212.6 13.011.2  Hematocrit 36.0 - 46.0 % 27.2(L) 37.8 33.5(L)  Platelets 150 - 400 K/uL 189 277 301   A POS  Assessment/Plan  Active Problems:   Group beta Strep positive   Encounter for elective induction of labor    Plan for discharge today.   Discharge Instructions: Per After Visit Summary. Activity: Advance as tolerated. Pelvic rest for 6 weeks.  Also refer to After Visit Summary Diet: Regular Medications: Allergies as of 04/02/2018   No Known Allergies     Medication List    STOP taking these medications   valACYclovir 1000 MG tablet Commonly known as:  VALTREX     TAKE these medications    docusate sodium 100 MG capsule Commonly known as:  COLACE Take 1 capsule (100 mg total) by mouth 2 (two) times daily.   ibuprofen 600 MG tablet Commonly known as:  ADVIL,MOTRIN Take 1 tablet (600 mg total) by mouth every 6 (six) hours.   prenatal multivitamin Tabs tablet Take 1 tablet by mouth daily at 12 noon.      Outpatient follow up: Harlow MaresMelody Shambley, CNM @ 6 wks Postpartum contraception: Will discuss at first office visit post-partum, she is unsure at this time.   Discharged Condition: good  Discharged to: home  Newborn Data: Disposition:home with mother  Apgars: APGAR (1 MIN): 9   APGAR (5 MINS): 10   APGAR (10 MINS):    Baby Feeding: Breast    Doreene Burkennie Natsuko Kelsay, CNM  04/02/2018 8:00 AM

## 2018-04-03 ENCOUNTER — Inpatient Hospital Stay: Admission: AD | Admit: 2018-04-03 | Payer: BLUE CROSS/BLUE SHIELD | Source: Home / Self Care

## 2018-05-12 ENCOUNTER — Encounter: Payer: Self-pay | Admitting: Obstetrics and Gynecology

## 2018-05-12 ENCOUNTER — Ambulatory Visit (INDEPENDENT_AMBULATORY_CARE_PROVIDER_SITE_OTHER): Payer: BLUE CROSS/BLUE SHIELD | Admitting: Obstetrics and Gynecology

## 2018-05-12 NOTE — Patient Instructions (Signed)
Place postpartum visit patient instructions here.   Postpartum Baby Blues The postpartum period begins right after the birth of a baby. During this time, there is often a lot of joy and excitement. It is also a time of many changes in the life of the parents. No matter how many times a mother gives birth, each child brings new challenges to the family, including different ways of relating to one another. It is common to have feelings of excitement along with confusing changes in moods, emotions, and thoughts. You may feel happy one minute and sad or stressed the next. These feelings of sadness usually happen in the period right after you have your baby, and they go away within a week or two. This is called the "baby blues." What are the causes? There is no known cause of baby blues. It is likely caused by a combination of factors. However, changes in hormone levels after childbirth are believed to trigger some of the symptoms. Other factors that can play a role in these mood changes include:  Lack of sleep.  Stressful life events, such as poverty, caring for a loved one, or death of a loved one.  Genetics. What are the signs or symptoms? Symptoms of this condition include:  Brief changes in mood, such as going from extreme happiness to sadness.  Decreased concentration.  Difficulty sleeping.  Crying spells and tearfulness.  Loss of appetite.  Irritability.  Anxiety. If the symptoms of baby blues last for more than 2 weeks or become more severe, you may have postpartum depression. How is this diagnosed? This condition is diagnosed based on an evaluation of your symptoms. There are no medical or lab tests that lead to a diagnosis, but there are various questionnaires that a health care provider may use to identify women with the baby blues or postpartum depression. How is this treated? Treatment is not needed for this condition. The baby blues usually go away on their own in 1-2  weeks. Social support is often all that is needed. You will be encouraged to get adequate sleep and rest. Follow these instructions at home: Lifestyle      Get as much rest as you can. Take a nap when the baby sleeps.  Exercise regularly as told by your health care provider. Some women find yoga and walking to be helpful.  Eat a balanced and nourishing diet. This includes plenty of fruits and vegetables, whole grains, and lean proteins.  Do little things that you enjoy. Have a cup of tea, take a bubble bath, read your favorite magazine, or listen to your favorite music.  Avoid alcohol.  Ask for help with household chores, cooking, grocery shopping, or running errands. Do not try to do everything yourself. Consider hiring a postpartum doula to help. This is a professional who specializes in providing support to new mothers.  Try not to make any major life changes during pregnancy or right after giving birth. This can add stress. General instructions  Talk to people close to you about how you are feeling. Get support from your partner, family members, friends, or other new moms. You may want to join a support group.  Find ways to cope with stress. This may include: ? Writing your thoughts and feelings in a journal. ? Spending time outside. ? Spending time with people who make you laugh.  Try to stay positive in how you think. Think about the things you are grateful for.  Take over-the-counter and prescription medicines only  as told by your health care provider.  Let your health care provider know if you have any concerns.  Keep all postpartum visits as told by your health care provider. This is important. Contact a health care provider if:  Your baby blues do not go away after 2 weeks. Get help right away if:  You have thoughts of taking your own life (suicidal thoughts).  You think you may harm the baby or other people.  You see or hear things that are not there  (hallucinations). Summary  After giving birth, you may feel happy one minute and sad or stressed the next. Feelings of sadness that happen right after the baby is born and go away after a week or two are called the "baby blues."  You can manage the baby blues by getting enough rest, eating a healthy diet, exercising, spending time with supportive people, and finding ways to cope with stress.  If feelings of sadness and stress last longer than 2 weeks or get in the way of caring for your baby, talk to your health care provider. This may mean you have postpartum depression. This information is not intended to replace advice given to you by your health care provider. Make sure you discuss any questions you have with your health care provider. Document Released: 01/03/2004 Document Revised: 05/27/2016 Document Reviewed: 05/27/2016 Elsevier Interactive Patient Education  2019 [employer].

## 2018-05-12 NOTE — Progress Notes (Signed)
  Subjective:     Cassandra Stephens is a 28 y.o. female who presents for a postpartum visit. She is 6 weeks postpartum following a spontaneous vaginal delivery. I have fully reviewed the prenatal and intrapartum course. The delivery was at 39.4 gestational weeks. Outcome: spontaneous vaginal delivery. Anesthesia: epidural. Postpartum course has been uncomplicated. Baby's course has been uncomplicated. Baby is feeding by breast. Bleeding no bleeding. Bowel function is normal. Bladder function is normal. Patient is not sexually active. Contraception method is abstinence. Postpartum depression screening: negative.  The following portions of the patient's history were reviewed and updated as appropriate: allergies, current medications, past family history, past medical history, past social history, past surgical history and problem list.  Review of Systems A comprehensive review of systems was negative.   Objective:    BP (!) 93/59   Pulse 81   Ht 5\' 4"  (1.626 m)   Wt 176 lb 12.8 oz (80.2 kg)   LMP  (LMP Unknown)   Breastfeeding Yes   BMI 30.35 kg/m   General:  alert, cooperative and appears stated age   Breasts:  inspection negative, no nipple discharge or bleeding, no masses or nodularity palpable  Lungs: clear to auscultation bilaterally  Heart:  regular rate and rhythm, S1, S2 normal, no murmur, click, rub or gallop  Abdomen: soft, non-tender; bowel sounds normal; no masses,  no organomegaly   Vulva:  normal  Vagina: normal vagina, no discharge, exudate, lesion, or erythema  Cervix:  multiparous appearance  Corpus: normal size, contour, position, consistency, mobility, non-tender  Adnexa:  no mass, fullness, tenderness  Rectal Exam: Not performed.        Assessment:     6 weeks postpartum exam. Pap smear not done at today's visit.  postpartum anemia  Plan:    1. Contraception: IUD desired 2. Labs obtained- will follow up accordingly 3. Follow up in: 3 months or as needed.

## 2018-05-13 ENCOUNTER — Other Ambulatory Visit: Payer: Self-pay | Admitting: Obstetrics and Gynecology

## 2018-05-13 DIAGNOSIS — E559 Vitamin D deficiency, unspecified: Secondary | ICD-10-CM | POA: Insufficient documentation

## 2018-05-13 LAB — CBC
Hematocrit: 37.1 % (ref 34.0–46.6)
Hemoglobin: 12.3 g/dL (ref 11.1–15.9)
MCH: 30.1 pg (ref 26.6–33.0)
MCHC: 33.2 g/dL (ref 31.5–35.7)
MCV: 91 fL (ref 79–97)
Platelets: 372 10*3/uL (ref 150–450)
RBC: 4.09 x10E6/uL (ref 3.77–5.28)
RDW: 13.1 % (ref 11.7–15.4)
WBC: 7.3 10*3/uL (ref 3.4–10.8)

## 2018-05-13 LAB — VITAMIN D 25 HYDROXY (VIT D DEFICIENCY, FRACTURES): Vit D, 25-Hydroxy: 23.7 ng/mL — ABNORMAL LOW (ref 30.0–100.0)

## 2018-05-13 LAB — FERRITIN: Ferritin: 24 ng/mL (ref 15–150)

## 2018-05-13 MED ORDER — VITAMIN D (ERGOCALCIFEROL) 1.25 MG (50000 UNIT) PO CAPS: 50000.0000 [IU] | ORAL_CAPSULE | ORAL | 1 refills | Status: DC

## 2018-05-14 ENCOUNTER — Ambulatory Visit (INDEPENDENT_AMBULATORY_CARE_PROVIDER_SITE_OTHER): Payer: BLUE CROSS/BLUE SHIELD | Admitting: Obstetrics and Gynecology

## 2018-05-14 ENCOUNTER — Encounter: Payer: Self-pay | Admitting: Obstetrics and Gynecology

## 2018-05-14 VITALS — BP 93/59 | HR 98 | Ht 64.0 in | Wt 176.0 lb

## 2018-05-14 DIAGNOSIS — Z30430 Encounter for insertion of intrauterine contraceptive device: Secondary | ICD-10-CM

## 2018-05-14 NOTE — Progress Notes (Signed)
Cassandra HarrisonHannah N Stephens is a 28 y.o. year old [id number] Caucasian female who presents for placement of a Mirena IUD.  No LMP recorded. (Menstrual status: Lactating). BP (!) 93/59   Pulse 98   Ht 5\' 4"  (1.626 m)   Wt 176 lb (79.8 kg)   BMI 30.21 kg/m  Last sexual intercourse was before delivery, and pregnancy test today was negative  The risks and benefits of the method and placement have been thouroughly reviewed with the patient and all questions were answered.  Specifically the patient is aware of failure rate of 04/998, expulsion of the IUD and of possible perforation.  The patient is aware of irregular bleeding due to the method and understands the incidence of irregular bleeding diminishes with time.  Signed copy of informed consent in chart.   Time out was performed.  A graves speculum was placed in the vagina.  The cervix was visualized, prepped using Betadine, and grasped with a single tooth tenaculum. The uterus was found to be neutral and it sounded to 7 cm.  Mirena IUD placed per manufacturer's recommendations.   The strings were trimmed to 3 cm.  The patient was given post procedure instructions, including signs and symptoms of infection and to check for the strings after each menses or each month, and refraining from intercourse or anything in the vagina for 3 days.  She was given a Mirena care card with date Mirena placed, and date Mirena to be removed.    Cassandra Stephens Cassandra Stephens, CNM

## 2018-05-14 NOTE — Patient Instructions (Signed)

## 2018-07-25 ENCOUNTER — Encounter: Payer: Self-pay | Admitting: Nurse Practitioner

## 2018-07-25 NOTE — Telephone Encounter (Signed)
Pt has npt been in the office for over a year

## 2018-08-10 ENCOUNTER — Encounter: Payer: Self-pay | Admitting: Obstetrics and Gynecology

## 2018-08-11 ENCOUNTER — Encounter: Payer: BLUE CROSS/BLUE SHIELD | Admitting: Obstetrics and Gynecology

## 2018-10-27 ENCOUNTER — Telehealth: Payer: Self-pay

## 2018-10-27 NOTE — Telephone Encounter (Signed)
CONTACTED PT IN REGARDS TO G[facility] IN CARE NEEDS TO BE SCHEDULED FOR AN APPT FU PCP.

## 2018-11-17 ENCOUNTER — Telehealth: Payer: Self-pay | Admitting: Family

## 2018-11-17 DIAGNOSIS — N61 Mastitis without abscess: Secondary | ICD-10-CM

## 2018-11-17 MED ORDER — CEPHALEXIN 500 MG PO CAPS: 500.0000 mg | ORAL_CAPSULE | Freq: Four times a day (QID) | ORAL | 0 refills | Status: DC

## 2018-11-17 NOTE — Progress Notes (Signed)
E Visit for Cellulitis  We are sorry that you are not feeling well. Here is how we plan to help!  Based on what you shared with me it looks like you have mastitis.  Usually, within the first 24 hours we treat with motrin, cool compresses, and complete emptying of the breast.   If your symptoms do not resolved over the next 24 hours, you can start Keflex 500 mg four times a day for 10 days.  You also need to follow up with your GYN.   Approximately 5 minutes was spent documenting and reviewing patient's chart.    HOME CARE:  . Take your medications as ordered and take all of them, even if the skin irritation appears to be healing.   GET HELP RIGHT AWAY IF:  . Symptoms that don't begin to go away within 48 hours. . Severe redness persists or worsens . If the area turns color, spreads or swells. . If it blisters and opens, develops yellow-brown crust or bleeds. . You develop a fever or chills. . If the pain increases or becomes unbearable.  . Are unable to keep fluids and food down.  MAKE SURE YOU    Understand these instructions.  Will watch your condition.  Will get help right away if you are not doing well or get worse.  Thank you for choosing an e-visit. Your e-visit answers were reviewed by a board certified advanced clinical practitioner to complete your personal care plan. Depending upon the condition, your plan could have included both over the counter or prescription medications. Please review your pharmacy choice. Make sure the pharmacy is open so you can pick up prescription now. If there is a problem, you may contact your provider through [employer] and have the prescription routed to another pharmacy. Your safety is important to [place]. If you have drug allergies check your prescription carefully.  For the next 24 hours you can use MyChart to ask questions about today's visit, request a non-urgent call back, or ask for a work or school excuse. You will get an email  in the next two days asking about your experience. I hope that your e-visit has been valuable and will speed your recovery.

## 2019-01-01 ENCOUNTER — Telehealth: Payer: Self-pay | Admitting: Certified Nurse Midwife

## 2019-01-01 ENCOUNTER — Telehealth: Payer: Self-pay | Admitting: Family

## 2019-01-01 DIAGNOSIS — N61 Mastitis without abscess: Secondary | ICD-10-CM

## 2019-01-01 NOTE — Telephone Encounter (Signed)
1048 Telephone call received from answering service.   Verified patient's full name and date of birth.   Reports mastitis symptoms in right breast. Completed E-visit and advised to follow up with Midwife.   Rx Dicloxacillin, see orders.   Advised home treatment measures including feeding, cleaning, dietary changes, and use of OTC medications.   Reviewed red flag symptoms and when to call.   Call clinic to schedule appointment in three (3) days if symptoms worsen or fail to improve.    Diona Fanti, CNM [facility], [facility] 01/01/19 11:23 AM

## 2019-01-01 NOTE — Progress Notes (Signed)
Based on what you shared with me, I feel your condition warrants further evaluation and I recommend that you be seen for a face to face office visit.  Given your symptoms and you were treated for mastitis about a month ago, you need to follow up with your GYN.   NOTE: If you entered your credit card information for this eVisit, you will not be charged. You may see a "hold" on your card for the $35 but that hold will drop off and you will not have a charge processed.  If you are having a true medical emergency please call 911.     For an urgent face to face visit, [place] has four urgent care centers for your convenience:   . [facility]    [phone]                  Get Driving Directions  [address], [place] [address] . 10 am to 8 pm Monday-Friday . 12 pm to 8 pm Saturday-Sunday   . [facility] at [place]                  Get Driving Directions  [address], [place], [place] [address] . 8 am to 8 pm Monday-Friday . 9 am to 6 pm Saturday . 11 am to 6 pm Sunday   . [facility] at [place]                  Get Driving Directions   [address] [place], [place] [address] . 8 am to 8 pm Monday-Friday . 8 am to 4 pm Saturday-Sunday    . [facility] at [place]                    Get Driving Directions  [phone]  [address] [place], [place] [address]  . Monday-Friday, 12 PM to 6 PM    Your e-visit answers were reviewed by a board certified advanced clinical practitioner to complete your personal care plan.  Thank you for using e-Visits.

## 2019-01-05 ENCOUNTER — Encounter: Payer: Self-pay | Admitting: Nurse Practitioner

## 2019-01-05 ENCOUNTER — Ambulatory Visit (INDEPENDENT_AMBULATORY_CARE_PROVIDER_SITE_OTHER): Payer: BC Managed Care – PPO | Admitting: Nurse Practitioner

## 2019-01-05 ENCOUNTER — Other Ambulatory Visit: Payer: Self-pay

## 2019-01-05 VITALS — BP 104/58 | HR 88 | Temp 97.9°F | Resp 16 | Ht 64.0 in | Wt 149.0 lb

## 2019-01-05 DIAGNOSIS — Z0001 Encounter for general adult medical examination with abnormal findings: Secondary | ICD-10-CM | POA: Diagnosis not present

## 2019-01-05 DIAGNOSIS — R300 Dysuria: Secondary | ICD-10-CM

## 2019-01-05 DIAGNOSIS — E559 Vitamin D deficiency, unspecified: Secondary | ICD-10-CM | POA: Diagnosis not present

## 2019-01-05 DIAGNOSIS — N61 Mastitis without abscess: Secondary | ICD-10-CM | POA: Diagnosis not present

## 2019-01-05 NOTE — Progress Notes (Signed)
[facility] [address] [place], [place] [address]  Internal MEDICINE  Office Visit Note  Patient Name: Cassandra Stephens  [id number]  [id number]  Date of Service: 01/05/2019   Pt is here for routine health maintenance examination   Chief Complaint  Patient presents with  . Annual Exam  . Anxiety  . Depression     The patient is here for routine health maintenance exam. She has 17 month old baby at home.who she is still breastfeeding. She recently had mastitis infection of right breast. She is currently on antibiotics and states that she is doing well. She plans to have well woman exams and pap smear through ob/gyn office. She is currently taking drisdol weekly for vitamin d which occurred after the birth of her child. Was recommended she have these levels rechecked. Should also have check of thyroid panel.     Current Medication: Outpatient Encounter Medications as of 01/05/2019  Medication Sig  . cephALEXin (KEFLEX) 500 MG capsule Take 1 capsule (500 mg total) by mouth 4 (four) times daily.  Marland Kitchen ibuprofen (ADVIL,MOTRIN) 600 MG tablet Take 1 tablet (600 mg total) by mouth every 6 (six) hours.  . Prenatal Vit-Fe Fumarate-FA (PRENATAL MULTIVITAMIN) TABS tablet Take 1 tablet by mouth daily at 12 noon.  . Vitamin D, Ergocalciferol, (DRISDOL) 1.25 MG (50000 UT) CAPS capsule Take 1 capsule (50,000 Units total) by mouth every 7 (seven) days.  . [DISCONTINUED] docusate sodium (COLACE) 100 MG capsule Take 1 capsule (100 mg total) by mouth 2 (two) times daily. (Patient not taking: Reported on 01/05/2019)   No facility-administered encounter medications on file as of 01/05/2019.     Surgical History: Past Surgical History:  Procedure Laterality Date  . TONSILECTOMY, ADENOIDECTOMY, BILATERAL MYRINGOTOMY AND TUBES    . TONSILLECTOMY Bilateral     Medical History: Past Medical History:  Diagnosis Date  . Anxiety   . Depression   . Herpes simplex virus (HSV) type I or type  II DNA not detected by PCR     Family History: Family History  Problem Relation Age of Onset  . Hyperlipidemia Father   . Hypertension Father   . Heart attack Father   . Diabetes Maternal Grandfather   . Diabetes Paternal Grandfather   . Heart disease Paternal Grandfather       Review of Systems  Constitutional: Negative for chills, fatigue and unexpected weight change.  HENT: Negative for congestion, postnasal drip, rhinorrhea, sneezing and sore throat.   Respiratory: Negative for cough, chest tightness and shortness of breath.   Cardiovascular: Negative for chest pain and palpitations.  Gastrointestinal: Negative for abdominal pain, constipation, diarrhea, nausea and vomiting.  Endocrine: Negative for cold intolerance, heat intolerance, polydipsia and polyuria.  Genitourinary: Negative for dysuria, flank pain, frequency and urgency.  Musculoskeletal: Negative for arthralgias, back pain, joint swelling and neck pain.  Skin: Negative for rash.  Neurological: Negative.  Negative for tremors and numbness.  Hematological: Negative for adenopathy. Does not bruise/bleed easily.  Psychiatric/Behavioral: Negative for behavioral problems (Depression), dysphoric mood, sleep disturbance and suicidal ideas. The patient is not nervous/anxious.      Today's Vitals   01/05/19 1106  BP: (!) 104/58  Pulse: 88  Resp: 16  Temp: 97.9 F (36.6 C)  SpO2: 94%  Weight: 149 lb (67.6 kg)  Height: 5\' 4"  (1.626 m)   Body mass index is 25.58 kg/m.  Physical Exam Vitals signs and nursing note reviewed.  Constitutional:      General: She  is not in acute distress.    Appearance: Normal appearance. She is well-developed. She is not diaphoretic.  HENT:     Head: Normocephalic and atraumatic.     Mouth/Throat:     Pharynx: No oropharyngeal exudate.  Eyes:     Pupils: Pupils are equal, round, and reactive to light.  Neck:     Musculoskeletal: Normal range of motion and neck supple.      Thyroid: No thyromegaly.     Vascular: No JVD.     Trachea: No tracheal deviation.  Cardiovascular:     Rate and Rhythm: Normal rate and regular rhythm.     Heart sounds: Normal heart sounds. No murmur. No friction rub. No gallop.   Pulmonary:     Effort: Pulmonary effort is normal. No respiratory distress.     Breath sounds: Normal breath sounds. No wheezing or rales.  Chest:     Chest wall: No tenderness.  Abdominal:     General: Bowel sounds are normal.     Palpations: Abdomen is soft.     Tenderness: There is no abdominal tenderness.  Musculoskeletal: Normal range of motion.  Lymphadenopathy:     Cervical: No cervical adenopathy.  Skin:    General: Skin is warm and dry.  Neurological:     Mental Status: She is alert and oriented to person, place, and time.     Cranial Nerves: No cranial nerve deficit.  Psychiatric:        Behavior: Behavior normal.        Thought Content: Thought content normal.        Judgment: Judgment normal.     Assessment/Plan: 1. Encounter for well adult exam with abnormal findings Annual health maintenance exam today. She plans to continue well-woman aspect of exam with OB/GYN provider for now.   2. Vitamin D deficiency Recheck Vitamin D level and treat as indicated   3. Mastitis, right, acute Improving. Continue with cephalexin as prescribed. Follow up with OBGYN as needed  4. Dysuria - UA/M w/rflx Culture, Routine  General Counseling: Cassandra Stephens verbalizes understanding of the findings of todays visit and agrees with plan of treatment. I have discussed any further diagnostic evaluation that may be needed or ordered today. We also reviewed her medications today. she has been encouraged to call the office with any questions or concerns that should arise related to todays visit.    Counseling:  This patient was seen by Leretha Pol FNP Collaboration with Dr Lavera Guise as a part of collaborative care agreement  Orders Placed This Encounter   Procedures  . UA/M w/rflx Culture, Routine      Time spent: [place], MD  Internal Medicine

## 2019-01-06 LAB — UA/M W/RFLX CULTURE, ROUTINE
Bilirubin, UA: NEGATIVE
Glucose, UA: NEGATIVE
Leukocytes,UA: NEGATIVE
Nitrite, UA: NEGATIVE
Protein,UA: NEGATIVE
RBC, UA: NEGATIVE
Specific Gravity, UA: 1.026 (ref 1.005–1.030)
Urobilinogen, Ur: 0.2 mg/dL (ref 0.2–1.0)
pH, UA: 6 (ref 5.0–7.5)

## 2019-01-06 LAB — MICROSCOPIC EXAMINATION
Bacteria, UA: NONE SEEN
Casts: NONE SEEN /lpf

## 2019-01-14 ENCOUNTER — Other Ambulatory Visit: Payer: Self-pay | Admitting: Nurse Practitioner

## 2019-01-14 DIAGNOSIS — Z0001 Encounter for general adult medical examination with abnormal findings: Secondary | ICD-10-CM | POA: Diagnosis not present

## 2019-01-14 DIAGNOSIS — E559 Vitamin D deficiency, unspecified: Secondary | ICD-10-CM | POA: Diagnosis not present

## 2019-01-15 LAB — TSH: TSH: 4.74 u[IU]/mL — ABNORMAL HIGH (ref 0.450–4.500)

## 2019-01-15 LAB — VITAMIN D 25 HYDROXY (VIT D DEFICIENCY, FRACTURES): Vit D, 25-Hydroxy: 59.4 ng/mL (ref 30.0–100.0)

## 2019-01-15 LAB — T4, FREE: Free T4: 1.12 ng/dL (ref 0.82–1.77)

## 2019-01-19 NOTE — Progress Notes (Signed)
Discuss with patient at visit 01/20/2019

## 2019-01-20 ENCOUNTER — Encounter: Payer: Self-pay | Admitting: Nurse Practitioner

## 2019-01-20 ENCOUNTER — Other Ambulatory Visit: Payer: Self-pay

## 2019-01-20 ENCOUNTER — Ambulatory Visit: Payer: BC Managed Care – PPO | Admitting: Nurse Practitioner

## 2019-01-20 VITALS — BP 98/73 | HR 82 | Temp 97.4°F | Resp 16 | Ht 64.0 in | Wt 147.0 lb

## 2019-01-20 DIAGNOSIS — E782 Mixed hyperlipidemia: Secondary | ICD-10-CM

## 2019-01-20 DIAGNOSIS — E039 Hypothyroidism, unspecified: Secondary | ICD-10-CM

## 2019-01-20 DIAGNOSIS — E559 Vitamin D deficiency, unspecified: Secondary | ICD-10-CM

## 2019-01-20 DIAGNOSIS — E038 Other specified hypothyroidism: Secondary | ICD-10-CM | POA: Insufficient documentation

## 2019-01-20 MED ORDER — LEVOTHYROXINE SODIUM 25 MCG PO TABS: 25.0000 ug | ORAL_TABLET | Freq: Every day | ORAL | 3 refills | Status: DC

## 2019-01-20 NOTE — Progress Notes (Signed)
[facility] [place], [place] [address]  Internal MEDICINE  Office Visit Note  Patient Name: Cassandra Stephens  [id number]  [id number]  Date of Service: 01/20/2019  Chief Complaint  Patient presents with  . Follow-up    labs    The patient is here for follow up of labs. She has a 55 month old baby at home who continues to breast feed. She states that during her pregnancy, she gained and excessive amount of weight. Saw nutritionist during pregnancy. Eating habits were good. No explanation ever given for the unusual weight gain.  Her labs reveal subclinical hypothyroid and mild to moderately elevated lipid panel. She does not understand the elevation of cholesterol. Since the birth of her chld, she has been very carefully monitoring her diet and exercising frequently.She is effectively losing weight.       Current Medication: Outpatient Encounter Medications as of 01/20/2019  Medication Sig  . cephALEXin (KEFLEX) 500 MG capsule Take 1 capsule (500 mg total) by mouth 4 (four) times daily.  Marland Kitchen ibuprofen (ADVIL,MOTRIN) 600 MG tablet Take 1 tablet (600 mg total) by mouth every 6 (six) hours.  Marland Kitchen levothyroxine (SYNTHROID) 25 MCG tablet Take 1 tablet (25 mcg total) by mouth daily before breakfast.  . Prenatal Vit-Fe Fumarate-FA (PRENATAL MULTIVITAMIN) TABS tablet Take 1 tablet by mouth daily at 12 noon.  . Vitamin D, Ergocalciferol, (DRISDOL) 1.25 MG (50000 UT) CAPS capsule Take 1 capsule (50,000 Units total) by mouth every 7 (seven) days.   No facility-administered encounter medications on file as of 01/20/2019.     Surgical History: Past Surgical History:  Procedure Laterality Date  . TONSILECTOMY, ADENOIDECTOMY, BILATERAL MYRINGOTOMY AND TUBES    . TONSILLECTOMY Bilateral     Medical History: Past Medical History:  Diagnosis Date  . Anxiety   . Depression   . Herpes simplex virus (HSV) type I or type II DNA not detected by PCR     Family History: Family  History  Problem Relation Age of Onset  . Hyperlipidemia Father   . Hypertension Father   . Heart attack Father   . Diabetes Maternal Grandfather   . Diabetes Paternal Grandfather   . Heart disease Paternal Grandfather     Social History   Socioeconomic History  . Marital status: Married    Spouse name: Not on file  . Number of children: Not on file  . Years of education: Not on file  . Highest education level: Not on file  Occupational History  . Not on file  Social Needs  . Financial resource strain: Not hard at all  . Food insecurity    Worry: Never true    Inability: Never true  . Transportation needs    Medical: No    Non-medical: No  Tobacco Use  . Smoking status: Never Smoker  . Smokeless tobacco: Never Used  Substance and Sexual Activity  . Alcohol use: Yes    Frequency: Never    Comment: ocassionally  . Drug use: Never  . Sexual activity: Not Currently    Birth control/protection: None  Lifestyle  . Physical activity    Days per week: 0 days    Minutes per session: 0 min  . Stress: Not at all  Relationships  . Social connections    Talks on phone: More than three times a week    Gets together: More than three times a week    Attends religious service: Never    Active member  of club or organization: No    Attends meetings of clubs or organizations: Never    Relationship status: Married  . Intimate partner violence    Fear of current or ex partner: No    Emotionally abused: No    Physically abused: No    Forced sexual activity: No  Other Topics Concern  . Not on file  Social History Narrative  . Not on file      Review of Systems  Constitutional: Positive for fatigue. Negative for chills and unexpected weight change.  HENT: Negative for congestion, postnasal drip, rhinorrhea, sneezing and sore throat.   Respiratory: Negative for cough, chest tightness and shortness of breath.   Cardiovascular: Negative for chest pain and palpitations.   Gastrointestinal: Negative for abdominal pain, constipation, diarrhea, nausea and vomiting.  Endocrine: Negative for cold intolerance, heat intolerance, polydipsia and polyuria.  Musculoskeletal: Negative for arthralgias, back pain, joint swelling and neck pain.  Skin: Negative for rash.  Neurological: Negative.  Negative for tremors and numbness.  Hematological: Negative for adenopathy. Does not bruise/bleed easily.  Psychiatric/Behavioral: Negative for behavioral problems (Depression), dysphoric mood, sleep disturbance and suicidal ideas. The patient is not nervous/anxious.     Today's Vitals   01/20/19 1300  BP: 98/73  Pulse: 82  Resp: 16  Temp: (!) 97.4 F (36.3 C)  SpO2: 99%  Weight: 147 lb (66.7 kg)  Height: 5\' 4"  (1.626 m)   Body mass index is 25.23 kg/m.  Physical Exam Vitals signs and nursing note reviewed.  Constitutional:      General: She is not in acute distress.    Appearance: Normal appearance. She is well-developed. She is not diaphoretic.  HENT:     Head: Normocephalic and atraumatic.     Mouth/Throat:     Pharynx: No oropharyngeal exudate.  Eyes:     Pupils: Pupils are equal, round, and reactive to light.  Neck:     Musculoskeletal: Normal range of motion and neck supple.     Thyroid: No thyromegaly.     Vascular: No JVD.     Trachea: No tracheal deviation.  Cardiovascular:     Rate and Rhythm: Normal rate and regular rhythm.     Heart sounds: Normal heart sounds. No murmur. No friction rub. No gallop.   Pulmonary:     Effort: Pulmonary effort is normal. No respiratory distress.     Breath sounds: Normal breath sounds. No wheezing or rales.  Chest:     Chest wall: No tenderness.  Abdominal:     General: Bowel sounds are normal.     Palpations: Abdomen is soft.     Tenderness: There is no abdominal tenderness.  Musculoskeletal: Normal range of motion.  Lymphadenopathy:     Cervical: No cervical adenopathy.  Skin:    General: Skin is warm and  dry.  Neurological:     Mental Status: She is alert and oriented to person, place, and time.     Cranial Nerves: No cranial nerve deficit.  Psychiatric:        Behavior: Behavior normal.        Thought Content: Thought content normal.        Judgment: Judgment normal.    Assessment/Plan: 1. Subclinical hypothyroidism Start levothyroxine 25cg every morning. Advised she take on empty stomach. Will recheck thyroid panel prior to next visit and adjust medication as indicated.  - levothyroxine (SYNTHROID) 25 MCG tablet; Take 1 tablet (25 mcg total) by mouth daily before breakfast.  Dispense:  30 tablet; Refill: 3  2. Mixed hyperlipidemia Reviewed labs showing generally elevated lipid panel. Discussed heart healthy diet and exercise as means to lower cholesterol. Will recheck with thyroid panel.   3. Vitamin D deficiency Improved. Recommended she take OTC vitamin d 2000iu every day   General Counseling: Dahlia ClientHannah verbalizes understanding of the findings of todays visit and agrees with plan of treatment. I have discussed any further diagnostic evaluation that may be needed or ordered today. We also reviewed her medications today. she has been encouraged to call the office with any questions or concerns that should arise related to todays visit.   This patient was seen by Vincent GrosHeather Kham Zuckerman FNP Collaboration with Dr Lyndon CodeFozia M Khan as a part of collaborative care agreement   Meds ordered this encounter  Medications  . levothyroxine (SYNTHROID) 25 MCG tablet    Sig: Take 1 tablet (25 mcg total) by mouth daily before breakfast.    Dispense:  30 tablet    Refill:  3    Order Specific Question:   Supervising Provider    Answer:   Lyndon CodeKHAN, FOZIA M [[id number]]    Time spent: 725 Minutes      Dr Lyndon CodeFozia M Khan Internal medicine

## 2019-03-24 ENCOUNTER — Other Ambulatory Visit: Payer: Self-pay | Admitting: Nurse Practitioner

## 2019-03-24 DIAGNOSIS — E039 Hypothyroidism, unspecified: Secondary | ICD-10-CM | POA: Diagnosis not present

## 2019-03-24 DIAGNOSIS — E782 Mixed hyperlipidemia: Secondary | ICD-10-CM | POA: Diagnosis not present

## 2019-03-25 LAB — LIPID PANEL W/O CHOL/HDL RATIO
Cholesterol, Total: 228 mg/dL — ABNORMAL HIGH (ref 100–199)
HDL: 50 mg/dL (ref >39)
LDL Chol Calc (NIH): 164 mg/dL — ABNORMAL HIGH (ref 0–99)
Triglycerides: 81 mg/dL (ref 0–149)
VLDL Cholesterol Cal: 14 mg/dL (ref 5–40)

## 2019-03-25 LAB — TSH: TSH: 2.5 u[IU]/mL (ref 0.450–4.500)

## 2019-03-25 LAB — T3: T3, Total: 121 ng/dL (ref 71–180)

## 2019-03-25 LAB — T4, FREE: Free T4: 1.4 ng/dL (ref 0.82–1.77)

## 2019-03-29 ENCOUNTER — Telehealth: Payer: Self-pay

## 2019-03-29 NOTE — Progress Notes (Signed)
Discuss with patient 03/31/2019

## 2019-03-29 NOTE — Telephone Encounter (Signed)
LMOM FOR PATIENT TO CONFIRM 03-31-19 OV AS VIRTUAL.

## 2019-03-31 ENCOUNTER — Other Ambulatory Visit: Payer: Self-pay

## 2019-03-31 ENCOUNTER — Ambulatory Visit (INDEPENDENT_AMBULATORY_CARE_PROVIDER_SITE_OTHER): Payer: BC Managed Care – PPO | Admitting: Nurse Practitioner

## 2019-03-31 ENCOUNTER — Encounter: Payer: Self-pay | Admitting: Nurse Practitioner

## 2019-03-31 VITALS — Ht 64.0 in | Wt 139.0 lb

## 2019-03-31 DIAGNOSIS — E782 Mixed hyperlipidemia: Secondary | ICD-10-CM | POA: Diagnosis not present

## 2019-03-31 DIAGNOSIS — E039 Hypothyroidism, unspecified: Secondary | ICD-10-CM

## 2019-03-31 DIAGNOSIS — E038 Other specified hypothyroidism: Secondary | ICD-10-CM

## 2019-03-31 NOTE — Progress Notes (Signed)
[facility] [address] [place], [place] [address]  Internal MEDICINE  Telephone Visit  Patient Name: Cassandra Stephens  [id number]  [id number]  Date of Service: 04/03/2019  I connected with the patient at 11:52am by webcam and verified the patients identity using two identifiers.   I discussed the limitations, risks, security and privacy concerns of performing an evaluation and management service by webcam and the availability of in person appointments. I also discussed with the patient that there may be a patient responsible charge related to the service.  The patient expressed understanding and agrees to proceed.    Chief Complaint  Patient presents with  . Telephone Screen  . Telephone Assessment  . Follow-up    labs  . Hypothyroidism    The patient has been contacted via webcam for follow up visit due to concerns for spread of novel coronavirus. The patient presents for follow up visit. Added levothyroxine daily. She is tolerating this well. She continues to eat a very healthy diet and exercises frequently. She has lost 8 pounds since she was last seen. She does feel good. Thyroid panel is within normal limits today. Rechecked lipid panel as well. LDL and total cholesterol have both come down more than 30 pointes since 01/14/2019. She has no new concerns or complaints today.       Current Medication: Outpatient Encounter Medications as of 03/31/2019  Medication Sig  . levothyroxine (SYNTHROID) 25 MCG tablet Take 1 tablet (25 mcg total) by mouth daily before breakfast.  . Prenatal Vit-Fe Fumarate-FA (PRENATAL MULTIVITAMIN) TABS tablet Take 1 tablet by mouth daily at 12 noon.  . Vitamin D, Ergocalciferol, (DRISDOL) 1.25 MG (50000 UT) CAPS capsule Take 1 capsule (50,000 Units total) by mouth every 7 (seven) days.  . cephALEXin (KEFLEX) 500 MG capsule Take 1 capsule (500 mg total) by mouth 4 (four) times daily.  Marland Kitchen ibuprofen (ADVIL,MOTRIN) 600 MG tablet Take 1  tablet (600 mg total) by mouth every 6 (six) hours.   No facility-administered encounter medications on file as of 03/31/2019.    Surgical History: Past Surgical History:  Procedure Laterality Date  . TONSILECTOMY, ADENOIDECTOMY, BILATERAL MYRINGOTOMY AND TUBES    . TONSILLECTOMY Bilateral     Medical History: Past Medical History:  Diagnosis Date  . Anxiety   . Depression   . Herpes simplex virus (HSV) type I or type II DNA not detected by PCR     Family History: Family History  Problem Relation Age of Onset  . Hyperlipidemia Father   . Hypertension Father   . Heart attack Father   . Diabetes Maternal Grandfather   . Diabetes Paternal Grandfather   . Heart disease Paternal Grandfather     Social History   Socioeconomic History  . Marital status: Married    Spouse name: Not on file  . Number of children: Not on file  . Years of education: Not on file  . Highest education level: Not on file  Occupational History  . Not on file  Tobacco Use  . Smoking status: Never Smoker  . Smokeless tobacco: Never Used  Substance and Sexual Activity  . Alcohol use: Yes    Comment: ocassionally  . Drug use: Never  . Sexual activity: Not Currently    Birth control/protection: None  Other Topics Concern  . Not on file  Social History Narrative  . Not on file   Social Determinants of Health   Financial Resource Strain:   . Difficulty of  Paying Living Expenses: Not on file  Food Insecurity:   . Worried About Charity fundraiser in the Last Year: Not on file  . Ran Out of Food in the Last Year: Not on file  Transportation Needs:   . Lack of Transportation (Medical): Not on file  . Lack of Transportation (Non-Medical): Not on file  Physical Activity:   . Days of Exercise per Week: Not on file  . Minutes of Exercise per Session: Not on file  Stress:   . Feeling of Stress : Not on file  Social Connections:   . Frequency of Communication with Friends and Family: Not on  file  . Frequency of Social Gatherings with Friends and Family: Not on file  . Attends Religious Services: Not on file  . Active Member of Clubs or Organizations: Not on file  . Attends Archivist Meetings: Not on file  . Marital Status: Not on file  Intimate Partner Violence:   . Fear of Current or Ex-Partner: Not on file  . Emotionally Abused: Not on file  . Physically Abused: Not on file  . Sexually Abused: Not on file      Review of Systems  Constitutional: Positive for fatigue. Negative for chills and unexpected weight change.       Improved weight gain. She has lost eight pounds since starting on levothyroxine.   HENT: Negative for congestion, postnasal drip, rhinorrhea, sneezing and sore throat.   Respiratory: Negative for cough, chest tightness and shortness of breath.   Cardiovascular: Negative for chest pain and palpitations.  Gastrointestinal: Negative for abdominal pain, constipation, diarrhea, nausea and vomiting.  Endocrine: Negative for cold intolerance, heat intolerance, polydipsia and polyuria.       Thyroid panel is normal.   Musculoskeletal: Negative for arthralgias, back pain, joint swelling and neck pain.  Skin: Negative for rash.  Allergic/Immunologic: Negative for environmental allergies.  Neurological: Negative for dizziness, tremors, numbness and headaches.  Hematological: Negative for adenopathy. Does not bruise/bleed easily.  Psychiatric/Behavioral: Negative for behavioral problems (Depression), dysphoric mood, sleep disturbance and suicidal ideas. The patient is not nervous/anxious.     Today's Vitals   03/31/19 1035  Weight: 139 lb (63 kg)  Height: 5\' 4"  (1.626 m)   Body mass index is 23.86 kg/m.  Observation/Objective:   The patient is alert and oriented. She is pleasant and answers all questions appropriately. Breathing is non-labored. She is in no acute distress at this time.    Assessment/Plan: 1. Subclinical  hypothyroidism Thyroid panel is within normal limits. Continue levothyroxine as prescribed.   2. Mixed hyperlipidemia LDL and total cholesterol have improved 30 points since she was last seen. Continue with diet and lifestyle changes. Will monitor closely.   General Counseling: Cassandra Stephens understanding of the findings of today's phone visit and agrees with plan of treatment. I have discussed any further diagnostic evaluation that may be needed or ordered today. We also reviewed her medications today. she has been encouraged to call the office with any questions or concerns that should arise related to todays visit.   This patient was seen by Leretha Pol FNP Collaboration with Dr Lavera Guise as a part of collaborative care agreement  Time spent: 67 Minutes    Dr Lavera Guise Internal medicine

## 2019-05-14 IMAGING — CR DG CHEST 2V
2 series · 2 of 2 positions shown · non-contrast
Comparison: None.

CLINICAL DATA: Chest pain and shortness of breath

EXAM:
CHEST - 2 VIEW

[chest pa]
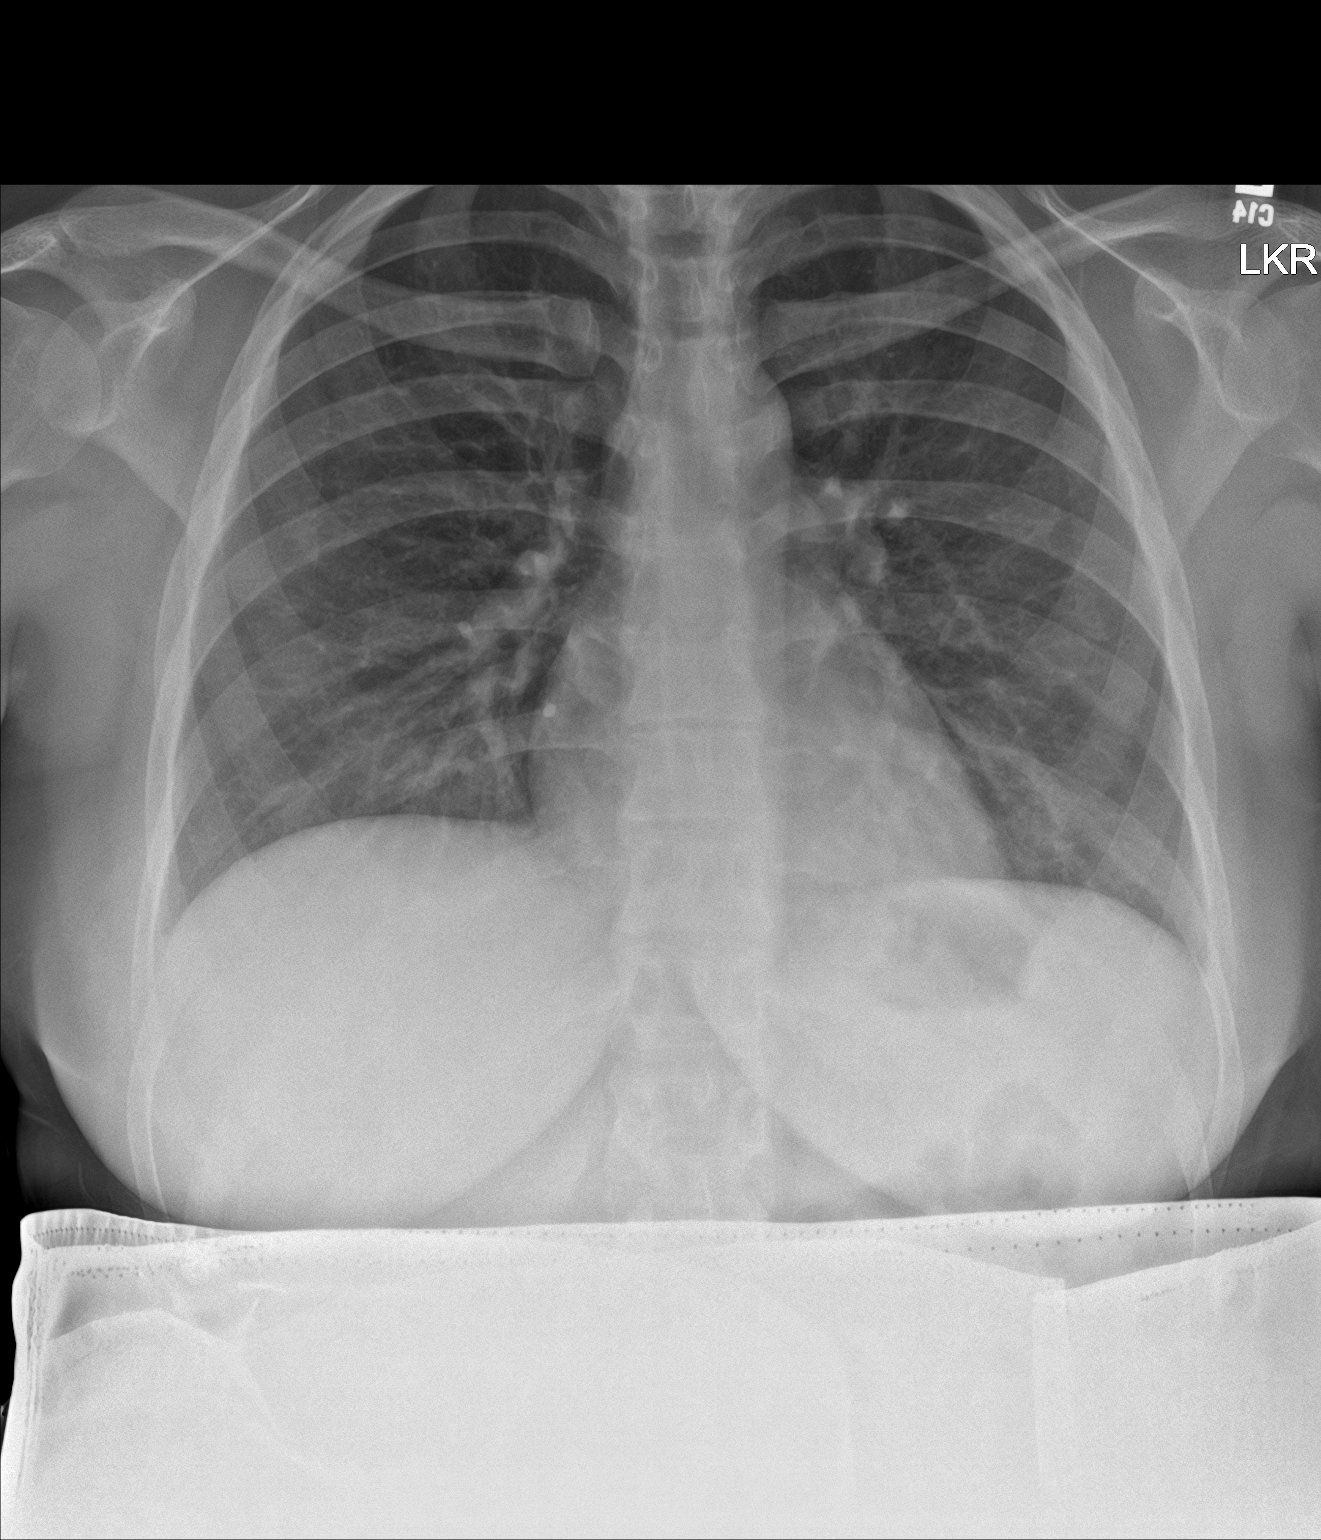

[chest lat]
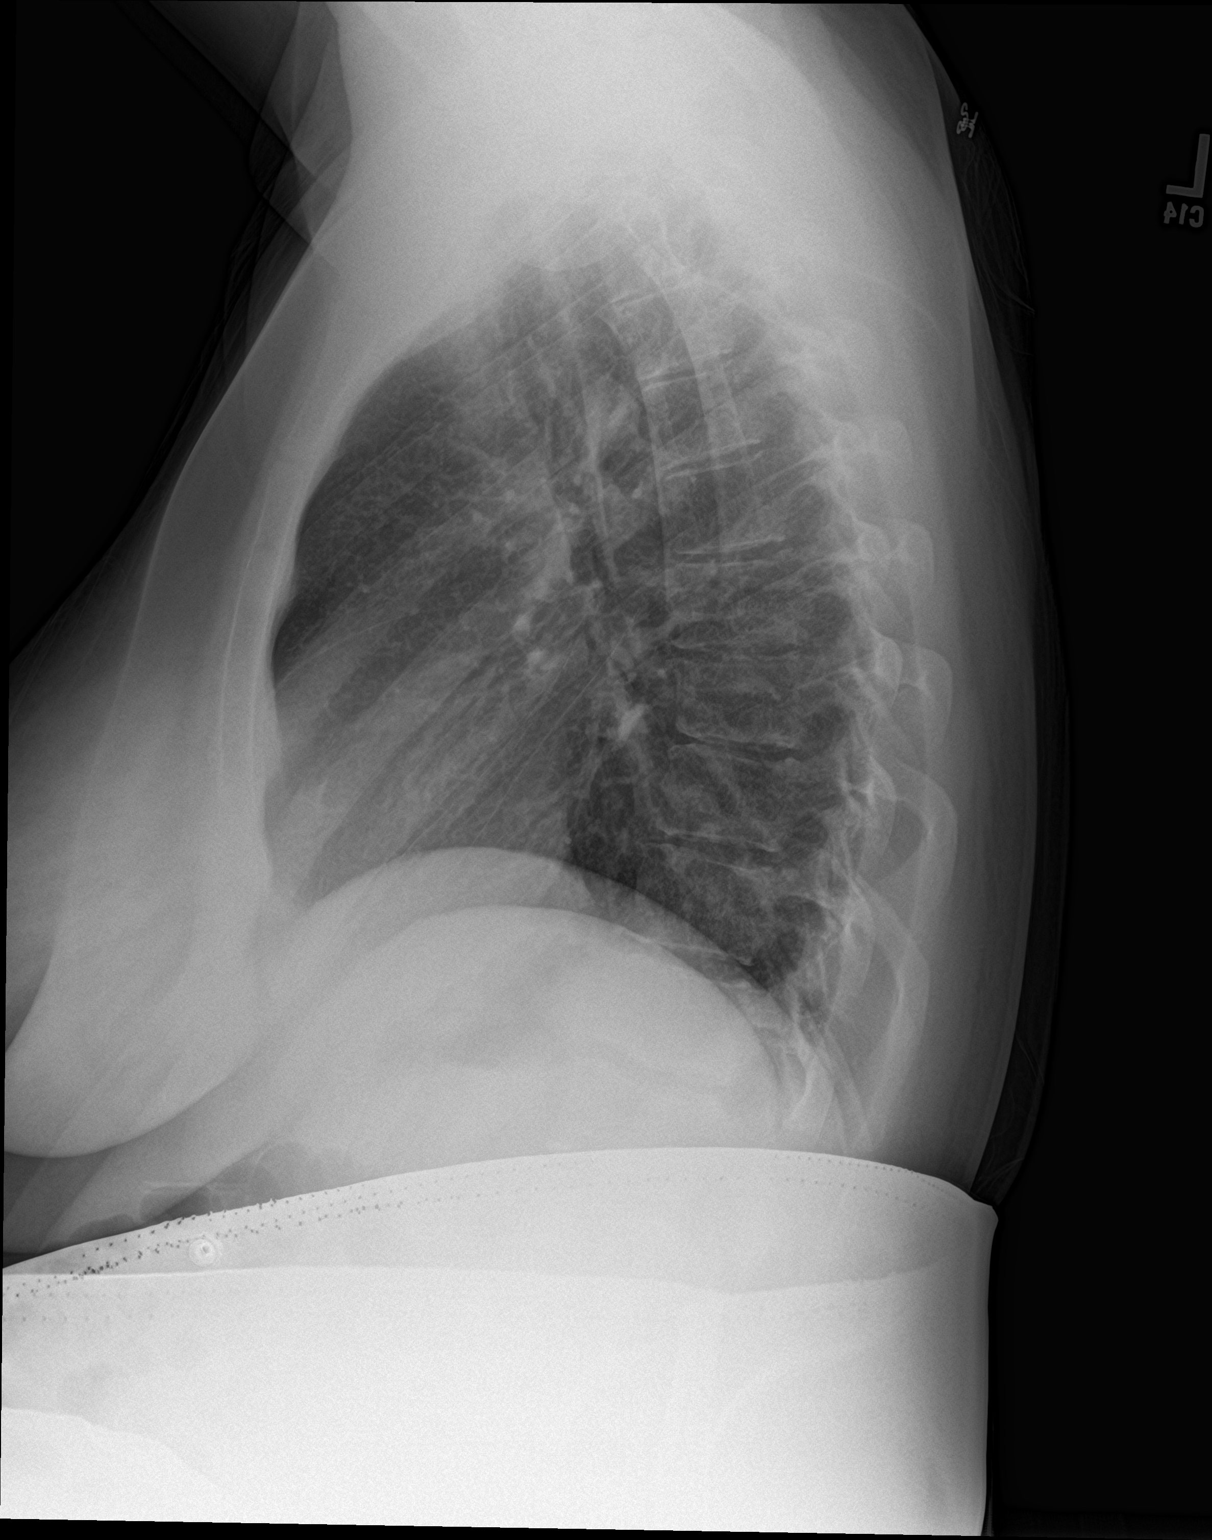

[2 of 2 positions shown; findings below may reference images not displayed]

FINDINGS: Note that patient's abdomen was shielded for this study.

There is no edema or consolidation. Heart size and pulmonary
vascularity are normal. No adenopathy. No pneumothorax or
pneumomediastinum. No evident bone lesions.
IMPRESSION: No abnormality noted.

## 2019-05-18 ENCOUNTER — Other Ambulatory Visit: Payer: Self-pay

## 2019-05-18 DIAGNOSIS — E038 Other specified hypothyroidism: Secondary | ICD-10-CM

## 2019-05-18 DIAGNOSIS — E039 Hypothyroidism, unspecified: Secondary | ICD-10-CM

## 2019-05-18 MED ORDER — LEVOTHYROXINE SODIUM 25 MCG PO TABS: 25.0000 ug | ORAL_TABLET | Freq: Every day | ORAL | 1 refills | Status: DC

## 2019-07-17 DIAGNOSIS — O9123 Nonpurulent mastitis associated with lactation: Secondary | ICD-10-CM | POA: Diagnosis not present

## 2019-09-27 ENCOUNTER — Telehealth: Payer: Self-pay

## 2019-09-27 NOTE — Telephone Encounter (Signed)
Lmom to confirm and screen for 09-29-19 ov. 

## 2019-09-28 ENCOUNTER — Telehealth: Payer: Self-pay

## 2019-09-28 NOTE — Telephone Encounter (Signed)
Patient rescheduled appointment on 09/29/2019 to 10/13/2019. klh

## 2019-09-29 ENCOUNTER — Ambulatory Visit: Payer: BC Managed Care – PPO | Admitting: Nurse Practitioner

## 2019-10-11 ENCOUNTER — Telehealth: Payer: Self-pay

## 2019-10-11 NOTE — Telephone Encounter (Signed)
Confirmed and screened for 10-13-19 ov. 

## 2019-10-13 ENCOUNTER — Other Ambulatory Visit: Payer: Self-pay

## 2019-10-13 ENCOUNTER — Ambulatory Visit: Payer: BC Managed Care – PPO | Admitting: Nurse Practitioner

## 2019-10-13 ENCOUNTER — Encounter: Payer: Self-pay | Admitting: Nurse Practitioner

## 2019-10-13 DIAGNOSIS — E039 Hypothyroidism, unspecified: Secondary | ICD-10-CM | POA: Diagnosis not present

## 2019-10-13 DIAGNOSIS — E038 Other specified hypothyroidism: Secondary | ICD-10-CM

## 2019-10-13 MED ORDER — LEVOTHYROXINE SODIUM 25 MCG PO TABS: 25.0000 ug | ORAL_TABLET | Freq: Every day | ORAL | 1 refills | Status: DC

## 2019-10-13 NOTE — Progress Notes (Signed)
[facility] [address] [place], [place] [address]  Internal MEDICINE  Office Visit Note  Patient Name: Cassandra Stephens  [id number]  [id number]  Date of Service: 10/19/2019  Chief Complaint  Patient presents with  . Follow-up  . Depression  . Fatigue    The patient is here for routine follow up. Continues to do well on low dose levothyroxine. She has lost 11 pounds since she first started on this medication. She reports having more energy than she did prior to starting this medication. Most recent check was done 03/2019 and was within normal limits.       Current Medication: Outpatient Encounter Medications as of 10/13/2019  Medication Sig  . levothyroxine (SYNTHROID) 25 MCG tablet Take 1 tablet (25 mcg total) by mouth daily before breakfast.  . Multiple Vitamins-Minerals (MULTIVITAMIN WOMEN) TABS Take by mouth.  . [DISCONTINUED] levothyroxine (SYNTHROID) 25 MCG tablet Take 1 tablet (25 mcg total) by mouth daily before breakfast.  . [DISCONTINUED] Prenatal Vit-Fe Fumarate-FA (PRENATAL MULTIVITAMIN) TABS tablet Take 1 tablet by mouth daily at 12 noon.  . [DISCONTINUED] Vitamin D, Ergocalciferol, (DRISDOL) 1.25 MG (50000 UT) CAPS capsule Take 1 capsule (50,000 Units total) by mouth every 7 (seven) days.   No facility-administered encounter medications on file as of 10/13/2019.    Surgical History: Past Surgical History:  Procedure Laterality Date  . TONSILECTOMY, ADENOIDECTOMY, BILATERAL MYRINGOTOMY AND TUBES    . TONSILLECTOMY Bilateral     Medical History: Past Medical History:  Diagnosis Date  . Anxiety   . Depression   . Herpes simplex virus (HSV) type I or type II DNA not detected by PCR     Family History: Family History  Problem Relation Age of Onset  . Hyperlipidemia Father   . Hypertension Father   . Heart attack Father   . Diabetes Maternal Grandfather   . Diabetes Paternal Grandfather   . Heart disease Paternal Grandfather     Social  History   Socioeconomic History  . Marital status: Married    Spouse name: Not on file  . Number of children: Not on file  . Years of education: Not on file  . Highest education level: Not on file  Occupational History  . Not on file  Tobacco Use  . Smoking status: Never Smoker  . Smokeless tobacco: Never Used  Vaping Use  . Vaping Use: Never used  Substance and Sexual Activity  . Alcohol use: Yes    Comment: ocassionally  . Drug use: Never  . Sexual activity: Not Currently    Birth control/protection: None  Other Topics Concern  . Not on file  Social History Narrative  . Not on file   Social Determinants of Health   Financial Resource Strain:   . Difficulty of Paying Living Expenses:   Food Insecurity:   . Worried About Programme researcher, broadcasting/film/video in the Last Year:   . Barista in the Last Year:   Transportation Needs:   . Freight forwarder (Medical):   Marland Kitchen Lack of Transportation (Non-Medical):   Physical Activity:   . Days of Exercise per Week:   . Minutes of Exercise per Session:   Stress:   . Feeling of Stress :   Social Connections:   . Frequency of Communication with Friends and Family:   . Frequency of Social Gatherings with Friends and Family:   . Attends Religious Services:   . Active Member of Clubs or Organizations:   .  Attends Banker Meetings:   Marland Kitchen Marital Status:   Intimate Partner Violence:   . Fear of Current or Ex-Partner:   . Emotionally Abused:   Marland Kitchen Physically Abused:   . Sexually Abused:       Review of Systems  Constitutional: Positive for fatigue. Negative for chills and unexpected weight change.       Aditional three pound weight loss since we last spoke.   HENT: Negative for congestion, postnasal drip, rhinorrhea, sneezing and sore throat.   Respiratory: Negative for cough, chest tightness and shortness of breath.   Cardiovascular: Negative for chest pain and palpitations.  Gastrointestinal: Negative for abdominal  pain, constipation, diarrhea, nausea and vomiting.  Endocrine: Negative for cold intolerance, heat intolerance, polydipsia and polyuria.       Thyroid panel is normal.   Musculoskeletal: Negative for arthralgias, back pain, joint swelling and neck pain.  Skin: Negative for rash.  Allergic/Immunologic: Negative for environmental allergies.  Neurological: Negative for dizziness, tremors, numbness and headaches.  Hematological: Negative for adenopathy. Does not bruise/bleed easily.  Psychiatric/Behavioral: Negative for behavioral problems (Depression), dysphoric mood, sleep disturbance and suicidal ideas. The patient is not nervous/anxious.     Today's Vitals   10/13/19 1529  BP: 108/66  Pulse: 77  Resp: 16  Temp: 97.8 F (36.6 C)  SpO2: 99%  Weight: 136 lb (61.7 kg)  Height: 5\' 4"  (1.626 m)   Body mass index is 23.34 kg/m.  Physical Exam Vitals and nursing note reviewed.  Constitutional:      General: She is not in acute distress.    Appearance: Normal appearance. She is well-developed. She is not diaphoretic.  HENT:     Head: Normocephalic and atraumatic.     Mouth/Throat:     Pharynx: No oropharyngeal exudate.  Eyes:     Pupils: Pupils are equal, round, and reactive to light.  Neck:     Thyroid: No thyromegaly.     Vascular: No JVD.     Trachea: No tracheal deviation.  Cardiovascular:     Rate and Rhythm: Normal rate and regular rhythm.     Heart sounds: Normal heart sounds. No murmur heard.  No friction rub. No gallop.   Pulmonary:     Effort: Pulmonary effort is normal. No respiratory distress.     Breath sounds: Normal breath sounds. No wheezing or rales.  Chest:     Chest wall: No tenderness.  Abdominal:     General: Bowel sounds are normal.     Palpations: Abdomen is soft.     Tenderness: There is no abdominal tenderness.  Musculoskeletal:        General: Normal range of motion.     Cervical back: Normal range of motion and neck supple.  Lymphadenopathy:      Cervical: No cervical adenopathy.  Skin:    General: Skin is warm and dry.  Neurological:     Mental Status: She is alert and oriented to person, place, and time.     Cranial Nerves: No cranial nerve deficit.  Psychiatric:        Mood and Affect: Mood normal.        Behavior: Behavior normal.        Thought Content: Thought content normal.        Judgment: Judgment normal.   Assessment/Plan: 1. Subclinical hypothyroidism Stable. Continue levothyroxine as prescribed. Refills provided today.  - levothyroxine (SYNTHROID) 25 MCG tablet; Take 1 tablet (25 mcg total) by mouth daily before  breakfast.  Dispense: 90 tablet; Refill: 1  General Counseling: Yazlin verbalizes understanding of the findings of todays visit and agrees with plan of treatment. I have discussed any further diagnostic evaluation that may be needed or ordered today. We also reviewed her medications today. she has been encouraged to call the office with any questions or concerns that should arise related to todays visit.   This patient was seen by Vincent Gros FNP Collaboration with Dr Lyndon Code as a part of collaborative care agreement  Meds ordered this encounter  Medications  . levothyroxine (SYNTHROID) 25 MCG tablet    Sig: Take 1 tablet (25 mcg total) by mouth daily before breakfast.    Dispense:  90 tablet    Refill:  1    Order Specific Question:   Supervising Provider    Answer:   Lyndon Code [[id number]]    Total time spent: 20 Minutes   Time spent includes review of chart, medications, test results, and follow up plan with the patient.      Dr Lyndon Code Internal medicine

## 2019-10-19 ENCOUNTER — Other Ambulatory Visit: Payer: Self-pay | Admitting: Nurse Practitioner

## 2019-10-19 DIAGNOSIS — E559 Vitamin D deficiency, unspecified: Secondary | ICD-10-CM | POA: Diagnosis not present

## 2019-10-19 DIAGNOSIS — E039 Hypothyroidism, unspecified: Secondary | ICD-10-CM | POA: Diagnosis not present

## 2019-10-19 DIAGNOSIS — D509 Iron deficiency anemia, unspecified: Secondary | ICD-10-CM | POA: Diagnosis not present

## 2019-10-20 LAB — CBC
Hematocrit: 39.6 % (ref 34.0–46.6)
Hemoglobin: 13.2 g/dL (ref 11.1–15.9)
MCH: 30.3 pg (ref 26.6–33.0)
MCHC: 33.3 g/dL (ref 31.5–35.7)
MCV: 91 fL (ref 79–97)
Platelets: 276 10*3/uL (ref 150–450)
RBC: 4.35 x10E6/uL (ref 3.77–5.28)
RDW: 12.2 % (ref 11.7–15.4)
WBC: 7 10*3/uL (ref 3.4–10.8)

## 2019-10-20 LAB — FERRITIN: Ferritin: 31 ng/mL (ref 15–150)

## 2019-10-20 LAB — TSH: TSH: 1.95 u[IU]/mL (ref 0.450–4.500)

## 2019-10-20 LAB — IRON AND TIBC
Iron Saturation: 27 % (ref 15–55)
Iron: 89 ug/dL (ref 27–159)
Total Iron Binding Capacity: 327 ug/dL (ref 250–450)
UIBC: 238 ug/dL (ref 131–425)

## 2019-10-20 LAB — B12 AND FOLATE PANEL
Folate: >20 ng/mL (ref >3.0)
Vitamin B-12: 1031 pg/mL (ref 232–1245)

## 2019-10-20 LAB — T4, FREE: Free T4: 1.27 ng/dL (ref 0.82–1.77)

## 2019-10-20 LAB — VITAMIN D 25 HYDROXY (VIT D DEFICIENCY, FRACTURES): Vit D, 25-Hydroxy: 49.3 ng/mL (ref 30.0–100.0)

## 2019-10-30 NOTE — Progress Notes (Signed)
Please let the patient know that her new labs were good. Does she need refill of levothyroxine? Thanks.

## 2019-10-31 ENCOUNTER — Telehealth: Payer: Self-pay

## 2019-10-31 NOTE — Telephone Encounter (Signed)
-----   Message from Carlean Jews, NP sent at 10/30/2019  8:50 PM EDT ----- Please let the patient know that her new labs were good. Does she need refill of levothyroxine? Thanks.

## 2019-10-31 NOTE — Telephone Encounter (Signed)
PT notified PT stated she should be fine on levothyroxine because she thinks it was filled on her last visit

## 2019-11-29 DIAGNOSIS — H00031 Abscess of right upper eyelid: Secondary | ICD-10-CM | POA: Diagnosis not present

## 2019-11-29 DIAGNOSIS — H04123 Dry eye syndrome of bilateral lacrimal glands: Secondary | ICD-10-CM | POA: Diagnosis not present

## 2020-01-06 ENCOUNTER — Ambulatory Visit (INDEPENDENT_AMBULATORY_CARE_PROVIDER_SITE_OTHER): Payer: BC Managed Care – PPO | Admitting: Nurse Practitioner

## 2020-01-06 ENCOUNTER — Other Ambulatory Visit: Payer: Self-pay

## 2020-01-06 ENCOUNTER — Encounter: Payer: Self-pay | Admitting: Nurse Practitioner

## 2020-01-06 VITALS — BP 103/58 | HR 72 | Temp 97.9°F | Resp 16 | Ht 64.0 in | Wt 137.4 lb

## 2020-01-06 DIAGNOSIS — E782 Mixed hyperlipidemia: Secondary | ICD-10-CM

## 2020-01-06 DIAGNOSIS — Z0001 Encounter for general adult medical examination with abnormal findings: Secondary | ICD-10-CM

## 2020-01-06 DIAGNOSIS — R5383 Other fatigue: Secondary | ICD-10-CM | POA: Diagnosis not present

## 2020-01-06 DIAGNOSIS — E039 Hypothyroidism, unspecified: Secondary | ICD-10-CM

## 2020-01-06 DIAGNOSIS — E038 Other specified hypothyroidism: Secondary | ICD-10-CM

## 2020-01-06 NOTE — Progress Notes (Signed)
[facility] [address] [place], [place] [address]  Internal MEDICINE  Office Visit Note  Patient Name: Cassandra Stephens  [id number]  [id number]  Date of Service: 01/22/2020   Pt is here for routine health maintenance examination   Chief Complaint  Patient presents with  . Annual Exam  . Depression  . Quality Metric Gaps    flu  . Fatigue     The patient is here for health maintenance exam. She continues to report fatigue. 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 Job has increased responsibility and this may be contributing to stress. Her last labs indicated that iron and thyroid panels were both normal.  She currently takes levothyroxine daily. Will recheck these labs today. She has IUD so perriods are irregular, if she has them at all, and very light. She does not recall her last regular menstrual cycle.    Current Medication: Outpatient Encounter Medications as of 01/06/2020  Medication Sig  . levothyroxine (SYNTHROID) 25 MCG tablet Take 1 tablet (25 mcg total) by mouth daily before breakfast.  . Multiple Vitamins-Minerals (MULTIVITAMIN WOMEN) TABS Take by mouth.   No facility-administered encounter medications on file as of 01/06/2020.    Surgical History: Past Surgical History:  Procedure Laterality Date  . TONSILECTOMY, ADENOIDECTOMY, BILATERAL MYRINGOTOMY AND TUBES    . TONSILLECTOMY Bilateral     Medical History: Past Medical History:  Diagnosis Date  . Anxiety   . Depression   . Herpes simplex virus (HSV) type I or type II DNA not detected by PCR     Family History: Family History  Problem Relation Age of Onset  . Hyperlipidemia Father   . Hypertension Father   . Heart attack Father   . Diabetes Maternal Grandfather   . Diabetes Paternal Grandfather   . Heart disease Paternal Grandfather       Review of Systems  Constitutional: Positive for fatigue. Negative for chills and unexpected weight change.   HENT: Negative for congestion, postnasal drip, rhinorrhea, sneezing and sore throat.   Respiratory: Negative for cough, chest tightness, shortness of breath and wheezing.   Cardiovascular: Negative for chest pain and palpitations.  Gastrointestinal: Negative for abdominal pain, constipation, diarrhea, nausea and vomiting.  Endocrine: Negative for cold intolerance, heat intolerance, polydipsia and polyuria.  Genitourinary: Negative for dysuria, frequency, menstrual problem and urgency.  Musculoskeletal: Negative for arthralgias, back pain, joint swelling and neck pain.  Skin: Negative for rash.  Allergic/Immunologic: Negative for environmental allergies.  Neurological: Negative for dizziness, tremors, numbness and headaches.  Hematological: Negative for adenopathy. Does not bruise/bleed easily.  Psychiatric/Behavioral: Negative for behavioral problems (Depression), sleep disturbance and suicidal ideas. The patient is not nervous/anxious.      Today's Vitals   01/06/20 1027  BP: (!) 103/58  Pulse: 72  Resp: 16  Temp: 97.9 F (36.6 C)  SpO2: 98%  Weight: 137 lb 6.4 oz (62.3 kg)  Height: 5\' 4"  (1.626 m)   Body mass index is 23.58 kg/m.  Physical Exam Vitals and nursing note reviewed.  Constitutional:      General: She is not in acute distress.    Appearance: Normal appearance. She is well-developed. She is not diaphoretic.  HENT:     Head: Normocephalic and atraumatic.     Nose: Nose normal.     Mouth/Throat:     Pharynx: No oropharyngeal exudate.  Eyes:     Pupils: Pupils are equal, round, and reactive to light.  Neck:     Thyroid: No thyromegaly.     Vascular: No JVD.     Trachea: No tracheal deviation.  Cardiovascular:     Rate and Rhythm: Normal rate and regular rhythm.     Pulses: Normal pulses.     Heart sounds: Normal heart sounds. No murmur heard.  No friction rub. No gallop.   Pulmonary:     Effort: Pulmonary effort is normal. No respiratory distress.      Breath sounds: Normal breath sounds. No wheezing or rales.  Chest:     Chest wall: No tenderness.     Breasts:        Right: Normal. No swelling, bleeding, inverted nipple, mass, nipple discharge, skin change or tenderness.        Left: Normal. No swelling, bleeding, inverted nipple, mass, nipple discharge, skin change or tenderness.  Abdominal:     General: Bowel sounds are normal.     Palpations: Abdomen is soft.     Tenderness: There is no abdominal tenderness.  Musculoskeletal:        General: Normal range of motion.     Cervical back: Normal range of motion and neck supple.  Lymphadenopathy:     Cervical: No cervical adenopathy.     Upper Body:     Right upper body: No axillary adenopathy.     Left upper body: No axillary adenopathy.  Skin:    General: Skin is warm and dry.     Capillary Refill: Capillary refill takes less than 2 seconds.  Neurological:     Mental Status: She is alert and oriented to person, place, and time.     Cranial Nerves: No cranial nerve deficit.  Psychiatric:        Mood and Affect: Mood normal.        Behavior: Behavior normal.        Thought Content: Thought content normal.        Judgment: Judgment normal.    Assessment/Plan: 1. Encounter for well adult exam with abnormal findings Annual health maintenance exam today. Will check labs.   2. Other fatigue Check labs, including thyroid and anemia panels.   3. Subclinical hypothyroidism Check thyroid panel and adjust levothyroxine dose as indicated.   4. Mixed hyperlipidemia Check fasting lipid panel.   General Counseling: jorja empie understanding of the findings of todays visit and agrees with plan of treatment. I have discussed any further diagnostic evaluation that may be needed or ordered today. We also reviewed her medications today. she has been encouraged to call the office with any questions or concerns that should arise related to todays visit.    Counseling:   This  patient was seen by Vincent Gros FNP Collaboration with Dr Lyndon Code as a part of collaborative care agreement  Total time spent: 40 Minutes  Time spent includes review of chart, medications, test results, and follow up plan with the patient.     Lyndon Code, MD  Internal Medicine

## 2020-01-22 DIAGNOSIS — R5383 Other fatigue: Secondary | ICD-10-CM | POA: Insufficient documentation

## 2020-04-09 ENCOUNTER — Telehealth: Payer: Self-pay

## 2020-04-09 NOTE — Telephone Encounter (Signed)
Pt called c/o having started with runny nose on sat night, sore throat, fever 102.4 with tylenol, and now low grade fever 99.1-99.5, and chills.  He son was having some symptoms also and when I spoke back to pt she was at his PCP having him tested for covid and flu.  Per Ladona Ridgel pt was informed to be retested for covid and flu and was advised to do at an urgent care.  Pt advised also to take tylenol and ibuprofen for fever and any body aches, OTC cold medication for her symptoms.  Pt also stated that she took a home covid test and was negative 2 days after symptoms started.  I informed pt that she may need to be retested as it may have been to early to show results.  Pt's husband was also having symptoms and advised for them both to be retested along with the flu test.

## 2020-04-10 ENCOUNTER — Other Ambulatory Visit: Payer: Self-pay

## 2020-04-10 ENCOUNTER — Ambulatory Visit
Admission: EM | Admit: 2020-04-10 | Discharge: 2020-04-10 | Disposition: A | Discharge status: Home / Self Care | Payer: BC Managed Care – PPO | Attending: Family Medicine | Admitting: Family Medicine

## 2020-04-10 DIAGNOSIS — B349 Viral infection, unspecified: Secondary | ICD-10-CM | POA: Diagnosis not present

## 2020-04-10 DIAGNOSIS — Z20822 Contact with and (suspected) exposure to covid-19: Secondary | ICD-10-CM | POA: Diagnosis not present

## 2020-04-10 HISTORY — DX: Disorder of thyroid, unspecified: E07.9

## 2020-04-10 MED ORDER — DEXAMETHASONE SODIUM PHOSPHATE 10 MG/ML IJ SOLN
10.0000 mg | Freq: Once | INTRAMUSCULAR | Status: AC
  Administered 2020-04-10: 10 mg via INTRAMUSCULAR

## 2020-04-10 MED ORDER — KETOROLAC TROMETHAMINE 30 MG/ML IJ SOLN
30.0000 mg | Freq: Once | INTRAMUSCULAR | Status: AC
  Administered 2020-04-10: 30 mg via INTRAMUSCULAR

## 2020-04-10 NOTE — Discharge Instructions (Addendum)
Viral illness Covid and flu swab pending. Nothing concerning on exam. Recommended cold and flu medications as needed. Rest, hydrate Follow up as needed for continued or worsening symptoms

## 2020-04-10 NOTE — ED Triage Notes (Signed)
Pt started having congestion/cold symptoms three days ago.  Felt that she was improving but woke up with severe HA.

## 2020-04-11 NOTE — ED Provider Notes (Signed)
Cassandra Stephens    CSN: [id number] Arrival date & time: 04/10/20  1329      History   Chief Complaint Chief Complaint  Patient presents with  . Headache    HPI Cassandra Stephens is a 29 y.o. female.   Patient is a 29 year old female presents today for cough, congestion, headache.  This started 3 days ago.  Felt she was getting better and then woke up with severe headache this morning.  Son has also been sick with similar symptoms.  No fever, chills, body aches.  No chest pain or shortness of breath.  No dizziness or blurred vision.  Took 2 Covid test at home that were negative     Past Medical History:  Diagnosis Date  . Anxiety   . Depression   . Herpes simplex virus (HSV) type I or type II DNA not detected by PCR   . Thyroid disease     Patient Active Problem List   Diagnosis Date Noted  . Other fatigue 01/22/2020  . Subclinical hypothyroidism 01/20/2019  . Mixed hyperlipidemia 01/20/2019  . Encounter for well adult exam with abnormal findings 01/05/2019  . Mastitis, right, acute 01/05/2019  . Vitamin D deficiency 05/13/2018    Past Surgical History:  Procedure Laterality Date  . TONSILECTOMY, ADENOIDECTOMY, BILATERAL MYRINGOTOMY AND TUBES    . TONSILLECTOMY Bilateral     OB History    Gravida  1   Para  1   Term  1   Preterm  0   AB  0   Living  1     SAB  0   IAB  0   Ectopic  0   Multiple  0   Live Births  1            Home Medications    Prior to Admission medications   Medication Sig Start Date End Date Taking? Authorizing Provider  levothyroxine (SYNTHROID) 25 MCG tablet Take 1 tablet (25 mcg total) by mouth daily before breakfast. 10/13/19  Yes Boscia, Kathlynn Grate, NP  Multiple Vitamins-Minerals (MULTIVITAMIN WOMEN) TABS Take by mouth.   Yes [provider]    Family History Family History  Problem Relation Age of Onset  . Hyperlipidemia Father   . Hypertension Father   . Heart attack Father   . Diabetes  Maternal Grandfather   . Diabetes Paternal Grandfather   . Heart disease Paternal Grandfather     Social History Social History   Tobacco Use  . Smoking status: Never Smoker  . Smokeless tobacco: Never Used  Vaping Use  . Vaping Use: Never used  Substance Use Topics  . Alcohol use: Yes    Comment: ocassionally  . Drug use: Never     Allergies   Patient has no known allergies.   Review of Systems Review of Systems   Physical Exam Triage Vital Signs ED Triage Vitals  Enc Vitals Group     BP 04/10/20 1523 119/77     Pulse Rate 04/10/20 1523 79     Resp 04/10/20 1523 16     Temp 04/10/20 1523 98.9 F (37.2 C)     Temp Source 04/10/20 1523 Oral     SpO2 04/10/20 1523 99 %     Weight --      Height --      Head Circumference --      Peak Flow --      Pain Score 04/10/20 1518 6     Pain  Loc --      Pain Edu? --      Excl. in GC? --    No data found.  Updated Vital Signs BP 119/77 (BP Location: Left Arm)   Pulse 79   Temp 98.9 F (37.2 C) (Oral)   Resp 16   SpO2 99%   Breastfeeding No   Visual Acuity Right Eye Distance:   Left Eye Distance:   Bilateral Distance:    Right Eye Near:   Left Eye Near:    Bilateral Near:     Physical Exam Vitals and nursing note reviewed.  Constitutional:      General: She is not in acute distress.    Appearance: Normal appearance. She is not ill-appearing, toxic-appearing or diaphoretic.  HENT:     Head: Normocephalic.     Right Ear: Tympanic membrane and ear canal normal.     Left Ear: Tympanic membrane and ear canal normal.     Nose: Congestion present.     Mouth/Throat:     Pharynx: Oropharynx is clear.  Eyes:     Conjunctiva/sclera: Conjunctivae normal.  Cardiovascular:     Rate and Rhythm: Normal rate and regular rhythm.  Pulmonary:     Effort: Pulmonary effort is normal.     Breath sounds: Normal breath sounds.  Musculoskeletal:        General: Normal range of motion.     Cervical back: Normal range  of motion.  Skin:    General: Skin is warm and dry.     Findings: No rash.  Neurological:     Mental Status: She is alert.  Psychiatric:        Mood and Affect: Mood normal.      UC Treatments / Results  Labs (all labs ordered are listed, but only abnormal results are displayed) Labs Reviewed  COVID-19, FLU A+B NAA    EKG   Radiology No results found.  Procedures Procedures (including critical care time)  Medications Ordered in UC Medications  ketorolac (TORADOL) 30 MG/ML injection 30 mg (30 mg Intramuscular Given 04/10/20 1606)  dexamethasone (DECADRON) injection 10 mg (10 mg Intramuscular Given 04/10/20 1605)    Initial Impression / Assessment and Plan / UC Course  I have reviewed the triage vital signs and the nursing notes.  Pertinent labs & imaging results that were available during my care of the patient were reviewed by me and considered in my medical decision making (see chart for details).     Viral illness Covid and flu swab pending.  Nothing concerning on exam. Gave migraine cocktail here for severe headache Recommend over-the-counter medicines as needed.  Rest, hydrate. Follow up as needed for continued or worsening symptoms  Final Clinical Impressions(s) / UC Diagnoses   Final diagnoses:  Viral illness     Discharge Instructions     Viral illness Covid and flu swab pending. Nothing concerning on exam. Recommended cold and flu medications as needed. Rest, hydrate Follow up as needed for continued or worsening symptoms      ED Prescriptions    None     PDMP not reviewed this encounter.   Dahlia Byes A, NP 04/11/20 1015

## 2020-04-12 LAB — COVID-19, FLU A+B NAA
Influenza A, NAA: NOT DETECTED
Influenza B, NAA: NOT DETECTED
SARS-CoV-2, NAA: NOT DETECTED

## 2020-04-23 ENCOUNTER — Other Ambulatory Visit: Payer: Self-pay

## 2020-04-23 DIAGNOSIS — E038 Other specified hypothyroidism: Secondary | ICD-10-CM

## 2020-04-23 MED ORDER — LEVOTHYROXINE SODIUM 25 MCG PO TABS: 25.0000 ug | ORAL_TABLET | Freq: Every day | ORAL | 1 refills | Status: DC

## 2020-05-20 ENCOUNTER — Other Ambulatory Visit: Payer: Self-pay | Admitting: Family

## 2020-05-20 ENCOUNTER — Telehealth: Payer: BC Managed Care – PPO | Admitting: Family

## 2020-05-20 DIAGNOSIS — U071 COVID-19: Secondary | ICD-10-CM

## 2020-05-20 MED ORDER — BENZONATATE 100 MG PO CAPS: 100.0000 mg | ORAL_CAPSULE | Freq: Three times a day (TID) | ORAL | 0 refills | Status: DC | PRN

## 2020-05-20 MED ORDER — MUPIROCIN CALCIUM 2 % EX CREA: 1.0000 "application " | TOPICAL_CREAM | Freq: Two times a day (BID) | CUTANEOUS | 0 refills | Status: DC

## 2020-05-20 NOTE — Progress Notes (Signed)
E-Visit for Corona Virus Screening  We are sorry you are not feeling well. We are here to help!  You have tested positive for COVID-19, meaning that you were infected with the novel coronavirus and could give the virus to others.  It is vitally important that you stay home so you do not spread it to others.      Please continue isolation at home, for at least 10 days since the start of your symptoms and until you have had 24 hours with no fever (without taking a fever reducer) and with improving of symptoms.  If you have no symptoms but tested positive (or all symptoms resolve after 5 days and you have no fever) you can leave your house but continue to wear a mask around others for an additional 5 days. If you have a fever,continue to stay home until you have had 24 hours of no fever. Most cases improve 5-10 days from onset but we have seen a small number of patients who have gotten worse after the 10 days.  Please be sure to watch for worsening symptoms and remain taking the proper precautions.   Go to the nearest hospital ED for assessment if fever/cough/breathlessness are severe or illness seems like a threat to life.    The following symptoms may appear 2-14 days after exposure: . Fever . Cough . Shortness of breath or difficulty breathing . Chills . Repeated shaking with chills . Muscle pain . Headache . Sore throat . New loss of taste or smell . Fatigue . Congestion or runny nose . Nausea or vomiting . Diarrhea  You have been enrolled in [facility] Monitoring for COVID-19. Daily you will receive a questionnaire within the MyChart website. Our COVID-19 response team will be monitoring your responses daily.  You can use medication such as A prescription cough medication called Tessalon Perles 100 mg. You may take 1-2 capsules every 8 hours as needed for cough and I have sent bactroban cream for you nose.    You may also take acetaminophen (Tylenol) as needed for fever.  HOME  CARE: . Only take medications as instructed by your medical team. . Drink plenty of fluids and get plenty of rest. . A steam or ultrasonic humidifier can help if you have congestion.   GET HELP RIGHT AWAY IF YOU HAVE EMERGENCY WARNING SIGNS.  Call 911 or proceed to your closest emergency facility if: . You develop worsening high fever. . Trouble breathing . Bluish lips or face . Persistent pain or pressure in the chest . New confusion . Inability to wake or stay awake . You cough up blood. . Your symptoms become more severe . Inability to hold down food or fluids  This list is not all possible symptoms. Contact your medical provider for any symptoms that are severe or concerning to you.    Your e-visit answers were reviewed by a board certified advanced clinical practitioner to complete your personal care plan.  Depending on the condition, your plan could have included both over the counter or prescription medications.  If there is a problem please reply once you have received a response from your provider.  Your safety is important to [place].  If you have drug allergies check your prescription carefully.    You can use MyChart to ask questions about today's visit, request a non-urgent call back, or ask for a work or school excuse for 24 hours related to this e-Visit. If it has been greater than 24  hours you will need to follow up with your provider, or enter a new e-Visit to address those concerns. You will get an e-mail in the next two days asking about your experience.  I hope that your e-visit has been valuable and will speed your recovery. Thank you for using e-visits.     Approximately 5 minutes was spent documenting and reviewing patient's chart.

## 2020-05-21 NOTE — Telephone Encounter (Signed)
Evisit 05/20/20  Pharmacy comment:  Alternative Requested:CREAM NOT COVERED BY INSURANCE. PLEASE CHANGE TO OINTMENT.

## 2020-05-22 ENCOUNTER — Other Ambulatory Visit: Payer: Self-pay | Admitting: Hospice and Palliative Medicine

## 2020-05-22 MED ORDER — PREDNISONE 10 MG PO TABS: ORAL_TABLET | ORAL | 0 refills | Status: DC

## 2020-05-22 MED ORDER — BENZONATATE 100 MG PO CAPS: 100.0000 mg | ORAL_CAPSULE | Freq: Two times a day (BID) | ORAL | 0 refills | Status: DC | PRN

## 2020-05-22 MED ORDER — AZITHROMYCIN 250 MG PO TABS: ORAL_TABLET | ORAL | 0 refills | Status: DC

## 2020-06-26 ENCOUNTER — Encounter: Payer: Self-pay | Admitting: Hospice and Palliative Medicine

## 2020-06-26 ENCOUNTER — Other Ambulatory Visit: Payer: Self-pay

## 2020-06-26 DIAGNOSIS — R5383 Other fatigue: Secondary | ICD-10-CM

## 2020-06-26 DIAGNOSIS — E038 Other specified hypothyroidism: Secondary | ICD-10-CM

## 2020-06-26 DIAGNOSIS — E782 Mixed hyperlipidemia: Secondary | ICD-10-CM

## 2020-06-26 NOTE — Telephone Encounter (Signed)
Please see

## 2020-07-02 ENCOUNTER — Other Ambulatory Visit: Payer: Self-pay

## 2020-07-02 ENCOUNTER — Ambulatory Visit (INDEPENDENT_AMBULATORY_CARE_PROVIDER_SITE_OTHER): Payer: BC Managed Care – PPO | Admitting: Hospice and Palliative Medicine

## 2020-07-02 ENCOUNTER — Encounter: Payer: Self-pay | Admitting: Hospice and Palliative Medicine

## 2020-07-02 VITALS — BP 96/74 | HR 72 | Temp 97.4°F | Resp 16 | Ht 64.0 in | Wt 138.2 lb

## 2020-07-02 DIAGNOSIS — Z113 Encounter for screening for infections with a predominantly sexual mode of transmission: Secondary | ICD-10-CM | POA: Diagnosis not present

## 2020-07-02 DIAGNOSIS — R300 Dysuria: Secondary | ICD-10-CM | POA: Diagnosis not present

## 2020-07-02 DIAGNOSIS — E038 Other specified hypothyroidism: Secondary | ICD-10-CM | POA: Diagnosis not present

## 2020-07-02 DIAGNOSIS — R5383 Other fatigue: Secondary | ICD-10-CM

## 2020-07-02 DIAGNOSIS — Z202 Contact with and (suspected) exposure to infections with a predominantly sexual mode of transmission: Secondary | ICD-10-CM | POA: Diagnosis not present

## 2020-07-02 DIAGNOSIS — Z124 Encounter for screening for malignant neoplasm of cervix: Secondary | ICD-10-CM | POA: Diagnosis not present

## 2020-07-02 DIAGNOSIS — F411 Generalized anxiety disorder: Secondary | ICD-10-CM

## 2020-07-02 DIAGNOSIS — Z0001 Encounter for general adult medical examination with abnormal findings: Secondary | ICD-10-CM

## 2020-07-02 MED ORDER — ALPRAZOLAM 0.25 MG PO TABS: 0.2500 mg | ORAL_TABLET | Freq: Every evening | ORAL | 0 refills | Status: DC | PRN

## 2020-07-02 MED ORDER — ESCITALOPRAM OXALATE 5 MG PO TABS: 5.0000 mg | ORAL_TABLET | Freq: Every day | ORAL | 1 refills | Status: DC

## 2020-07-02 NOTE — Progress Notes (Signed)
[facility] [address] [place], [place] [address]  Internal MEDICINE  Office Visit Note  Patient Name: Cassandra Stephens  [id number]  [id number]  Date of Service: 07/03/2020  Chief Complaint  Patient presents with  . Annual Exam    Discuss anxiety  . Depression  . Anxiety  . Quality Metric Gaps    pap     HPI Pt is here for routine health maintenance examination Overall things have been going well Having increased anxiety--notices that she becomes more irritable and focused on small things that are causing her more stress and anxiety Has a significant family history of mental illness which has caused her to try and work through her anxiety for so long without acknowledging it Has taken alprazolam as needed in the past which has been helpful--discussed that alprazolam is as needed to treat situational and severe anxiety, we discussed alternative options such as SSRI/SNRI Sleep does not seem to be an issue--sleeps well at night and feels rested throughout the day  Due for routine labs and PAP today  Current Medication: Outpatient Encounter Medications as of 07/02/2020  Medication Sig  . ALPRAZolam (XANAX) 0.25 MG tablet Take 1 tablet (0.25 mg total) by mouth at bedtime as needed for anxiety.  Marland Kitchen escitalopram (LEXAPRO) 5 MG tablet Take 1 tablet (5 mg total) by mouth daily.  Marland Kitchen levothyroxine (SYNTHROID) 25 MCG tablet Take 1 tablet (25 mcg total) by mouth daily before breakfast.  . Multiple Vitamins-Minerals (MULTIVITAMIN WOMEN) TABS Take by mouth.  . mupirocin cream (BACTROBAN) 2 % Apply 1 application topically 2 (two) times daily.  . [DISCONTINUED] azithromycin (ZITHROMAX Z-PAK) 250 MG tablet Take two 250 mg tablets on day one followed by one 250 mg tablet each day for four days.  . [DISCONTINUED] benzonatate (TESSALON) 100 MG capsule Take 1 capsule (100 mg total) by mouth 2 (two) times daily as needed for cough.  . [DISCONTINUED] predniSONE (DELTASONE) 10 MG tablet  Take 1 tablet three times a day with a meal for three for three days, take 1 tablet by twice daily with a meal for 3 days, take 1 tablet once daily with a meal for 3 days   No facility-administered encounter medications on file as of 07/02/2020.    Surgical History: Past Surgical History:  Procedure Laterality Date  . TONSILECTOMY, ADENOIDECTOMY, BILATERAL MYRINGOTOMY AND TUBES    . TONSILLECTOMY Bilateral     Medical History: Past Medical History:  Diagnosis Date  . Anxiety   . Depression   . Herpes simplex virus (HSV) type I or type II DNA not detected by PCR   . Thyroid disease     Family History: Family History  Problem Relation Age of Onset  . Hyperlipidemia Father   . Hypertension Father   . Heart attack Father   . Diabetes Maternal Grandfather   . Diabetes Paternal Grandfather   . Heart disease Paternal Grandfather     Review of Systems  Constitutional: Negative for chills, diaphoresis and fatigue.  HENT: Negative for ear pain, postnasal drip and sinus pressure.   Eyes: Negative for photophobia, discharge, redness, itching and visual disturbance.  Respiratory: Negative for cough, shortness of breath and wheezing.   Cardiovascular: Negative for chest pain, palpitations and leg swelling.  Gastrointestinal: Negative for abdominal pain, constipation, diarrhea, nausea and vomiting.  Genitourinary: Negative for dysuria and flank pain.  Musculoskeletal: Negative for arthralgias, back pain, gait problem and neck pain.  Skin: Negative for color change.  Allergic/Immunologic: Negative for  environmental allergies and food allergies.  Neurological: Negative for dizziness and headaches.  Hematological: Does not bruise/bleed easily.  Psychiatric/Behavioral: Negative for agitation, behavioral problems (depression) and hallucinations.     Vital Signs: BP 96/74   Pulse 72   Temp (!) 97.4 F (36.3 C)   Resp 16   Ht 5\' 4"  (1.626 m)   Wt 138 lb 3.2 oz (62.7 kg)   SpO2 99%    BMI 23.72 kg/m    Physical Exam Vitals reviewed.  Constitutional:      Appearance: Normal appearance. She is normal weight.  Cardiovascular:     Rate and Rhythm: Normal rate and regular rhythm.     Pulses: Normal pulses.     Heart sounds: Normal heart sounds.  Pulmonary:     Effort: Pulmonary effort is normal.     Breath sounds: Normal breath sounds.  Chest:  Breasts:     Right: Normal.     Left: Normal.    Abdominal:     General: Abdomen is flat.     Palpations: Abdomen is soft.  Genitourinary:    Vagina: Normal.     Cervix: Normal.     Uterus: Normal.      Adnexa: Right adnexa normal and left adnexa normal.  Musculoskeletal:        General: Normal range of motion.     Cervical back: Normal range of motion.  Skin:    General: Skin is warm.  Neurological:     General: No focal deficit present.     Mental Status: She is alert and oriented to person, place, and time. Mental status is at baseline.  Psychiatric:        Mood and Affect: Mood normal.        Behavior: Behavior normal.        Thought Content: Thought content normal.        Judgment: Judgment normal.      LABS: Recent Results (from the past 2160 hour(s))  Covid-19, Flu A+B ([facility])     Status: None   Collection Time: 04/10/20  3:52 PM   Specimen: Nasopharyngeal   Naso  Result Value Ref Range   SARS-CoV-2, NAA Not Detected Not Detected    Comment: This nucleic acid amplification test was developed and its performance characteristics determined by 04/12/20. Nucleic acid amplification tests include RT-PCR and TMA. This test has not been FDA cleared or approved. This test has been authorized by FDA under an Emergency Use Authorization (EUA). This test is only authorized for the duration of time the declaration that circumstances exist justifying the authorization of the emergency use of in vitro diagnostic tests for detection of SARS-CoV-2 virus and/or diagnosis of COVID-19 infection  under section 564(b)(1) of the Act, 21 U.S.C. [employer]) (1), unless the authorization is terminated or revoked sooner. When diagnostic testing is negative, the possibility of a false negative result should be considered in the context of a patient's recent exposures and the presence of clinical signs and symptoms consistent with COVID-19. An individual without symptoms of COVID-19 and who is not shedding SARS-CoV-2 virus wo uld expect to have a negative (not detected) result in this assay.    Influenza A, NAA Not Detected Not Detected   Influenza B, NAA Not Detected Not Detected  UA/M w/rflx Culture, Routine     Status: None   Collection Time: 07/02/20  9:05 AM   Specimen: Urine   Urine  Result Value Ref Range   Specific Gravity, UA  1.025 1.005 - 1.030   pH, UA 6.5 5.0 - 7.5   Color, UA Yellow Yellow   Appearance Ur Clear Clear   Leukocytes,UA Negative Negative   Protein,UA Negative Negative/Trace   Glucose, UA Negative Negative   Ketones, UA Negative Negative   RBC, UA Negative Negative   Bilirubin, UA Negative Negative   Urobilinogen, Ur 0.2 0.2 - 1.0 mg/dL   Nitrite, UA Negative Negative   Microscopic Examination Comment     Comment: Microscopic follows if indicated.   Microscopic Examination See below:     Comment: Microscopic was indicated and was performed.   Urinalysis Reflex Comment     Comment: This specimen will not reflex to a Urine Culture.  Microscopic Examination     Status: Abnormal   Collection Time: 07/02/20  9:05 AM   Urine  Result Value Ref Range   WBC, UA 0-5 0 - 5 /hpf   RBC 3-10 (A) 0 - 2 /hpf   Epithelial Cells (non renal) >10 (A) 0 - 10 /hpf   Casts Present (A) None seen /lpf   Cast Type Epithelial cell casts (A) N/A   Bacteria, UA Few None seen/Few   Assessment/Plan: 1. Encounter for routine adult health examination with abnormal findings Well appearing 30 year old female Will review routine labs and adjust therapy as indicated Up to date  on PHM - CBC w/Diff/Platelet - Comprehensive Metabolic Panel (CMET) - Lipid Panel With LDL/HDL Ratio  2. Subclinical hypothyroidism Continue with current dose of levothyroxine--will review updated labs and adjust dosing as indicated - TSH + free T4  3. GAD (generalized anxiety disorder) Plan to start lexapro after labs have been reviewed for potential underlying causes of increased anxiety May use alprazolam as needed for severe anxiety [place] Controlled Substance Database was reviewed by me for overdose risk score (ORS) Reviewed risks and possible side effects associated with taking opiates, benzodiazepines and other CNS depressants. Combination of these could cause dizziness and drowsiness. Advised patient not to drive or operate machinery when taking these medications, as patient's and other's life can be at risk and will have consequences. Patient verbalized understanding in this matter. Dependence and abuse for these drugs will be monitored closely. A Controlled substance policy and procedure is on file which allows [facility] to order a urine drug screen test at any visit. Patient understands and agrees with the plan - escitalopram (LEXAPRO) 5 MG tablet; Take 1 tablet (5 mg total) by mouth daily.  Dispense: 90 tablet; Refill: 1 - ALPRAZolam (XANAX) 0.25 MG tablet; Take 1 tablet (0.25 mg total) by mouth at bedtime as needed for anxiety.  Dispense: 30 tablet; Refill: 0  4. Other fatigue - CBC w/Diff/Platelet - Comprehensive Metabolic Panel (CMET) - Lipid Panel With LDL/HDL Ratio - TSH + free T4  5. Routine cervical smear - IGP, Aptima HPV  6. Screening for STDs (sexually transmitted diseases) - NuSwab Vaginitis Plus (VG+)  7. Dysuria - UA/M w/rflx Culture, Routine - Microscopic Examination  General Counseling: Dahlia ClientHannah verbalizes understanding of the findings of todays visit and agrees with plan of treatment. I have discussed any further diagnostic evaluation that may be  needed or ordered today. We also reviewed her medications today. she has been encouraged to call the office with any questions or concerns that should arise related to todays visit.    Counseling:    Orders Placed This Encounter  Procedures  . Microscopic Examination  . UA/M w/rflx Culture, Routine  . CBC w/Diff/Platelet  .  Comprehensive Metabolic Panel (CMET)  . Lipid Panel With LDL/HDL Ratio  . TSH + free T4  . NuSwab Vaginitis Plus (VG+)    Meds ordered this encounter  Medications  . escitalopram (LEXAPRO) 5 MG tablet    Sig: Take 1 tablet (5 mg total) by mouth daily.    Dispense:  90 tablet    Refill:  1  . ALPRAZolam (XANAX) 0.25 MG tablet    Sig: Take 1 tablet (0.25 mg total) by mouth at bedtime as needed for anxiety.    Dispense:  30 tablet    Refill:  0    Total time spent: 30 Minutes  Time spent includes review of chart, medications, test results, and follow up plan with the patient.   This patient was seen by Leeanne Deed AGNP-C Collaboration with Dr Lyndon Code as a part of collaborative care agreement   Lubertha Basque. [facility] Internal Medicine

## 2020-07-03 ENCOUNTER — Encounter: Payer: Self-pay | Admitting: Hospice and Palliative Medicine

## 2020-07-03 LAB — UA/M W/RFLX CULTURE, ROUTINE
Bilirubin, UA: NEGATIVE
Glucose, UA: NEGATIVE
Ketones, UA: NEGATIVE
Leukocytes,UA: NEGATIVE
Nitrite, UA: NEGATIVE
Protein,UA: NEGATIVE
RBC, UA: NEGATIVE
Specific Gravity, UA: 1.025 (ref 1.005–1.030)
Urobilinogen, Ur: 0.2 mg/dL (ref 0.2–1.0)
pH, UA: 6.5 (ref 5.0–7.5)

## 2020-07-03 LAB — MICROSCOPIC EXAMINATION: Epithelial Cells (non renal): >10 /hpf — AB (ref 0–10)

## 2020-07-04 ENCOUNTER — Encounter: Payer: Self-pay | Admitting: Hospice and Palliative Medicine

## 2020-07-04 LAB — NUSWAB VAGINITIS PLUS (VG+)
Candida albicans, NAA: NEGATIVE
Candida glabrata, NAA: NEGATIVE
Chlamydia trachomatis, NAA: NEGATIVE
Neisseria gonorrhoeae, NAA: NEGATIVE
Trich vag by NAA: NEGATIVE

## 2020-07-05 LAB — IGP, APTIMA HPV: HPV Aptima: NEGATIVE

## 2020-07-09 DIAGNOSIS — E038 Other specified hypothyroidism: Secondary | ICD-10-CM | POA: Diagnosis not present

## 2020-07-09 DIAGNOSIS — R5383 Other fatigue: Secondary | ICD-10-CM | POA: Diagnosis not present

## 2020-07-09 DIAGNOSIS — Z0001 Encounter for general adult medical examination with abnormal findings: Secondary | ICD-10-CM | POA: Diagnosis not present

## 2020-07-10 LAB — CBC WITH DIFFERENTIAL/PLATELET
Basophils Absolute: 0 10*3/uL (ref 0.0–0.2)
Basos: 1 %
EOS (ABSOLUTE): 0.2 10*3/uL (ref 0.0–0.4)
Eos: 4 %
Hematocrit: 40.8 % (ref 34.0–46.6)
Hemoglobin: 13.5 g/dL (ref 11.1–15.9)
Immature Grans (Abs): 0 10*3/uL (ref 0.0–0.1)
Immature Granulocytes: 0 %
Lymphocytes Absolute: 1.6 10*3/uL (ref 0.7–3.1)
Lymphs: 37 %
MCH: 30.2 pg (ref 26.6–33.0)
MCHC: 33.1 g/dL (ref 31.5–35.7)
MCV: 91 fL (ref 79–97)
Monocytes Absolute: 0.5 10*3/uL (ref 0.1–0.9)
Monocytes: 11 %
Neutrophils Absolute: 2 10*3/uL (ref 1.4–7.0)
Neutrophils: 47 %
Platelets: 280 10*3/uL (ref 150–450)
RBC: 4.47 x10E6/uL (ref 3.77–5.28)
RDW: 12.3 % (ref 11.7–15.4)
WBC: 4.3 10*3/uL (ref 3.4–10.8)

## 2020-07-10 LAB — COMPREHENSIVE METABOLIC PANEL
ALT: 18 IU/L (ref 0–32)
AST: 19 IU/L (ref 0–40)
Albumin/Globulin Ratio: 2.4 — ABNORMAL HIGH (ref 1.2–2.2)
Albumin: 5 g/dL (ref 3.9–5.0)
Alkaline Phosphatase: 43 IU/L — ABNORMAL LOW (ref 44–121)
BUN/Creatinine Ratio: 24 — ABNORMAL HIGH (ref 9–23)
BUN: 18 mg/dL (ref 6–20)
Bilirubin Total: 0.3 mg/dL (ref 0.0–1.2)
CO2: 20 mmol/L (ref 20–29)
Calcium: 9.5 mg/dL (ref 8.7–10.2)
Chloride: 102 mmol/L (ref 96–106)
Creatinine, Ser: 0.76 mg/dL (ref 0.57–1.00)
Globulin, Total: 2.1 g/dL (ref 1.5–4.5)
Glucose: 88 mg/dL (ref 65–99)
Potassium: 5.1 mmol/L (ref 3.5–5.2)
Sodium: 140 mmol/L (ref 134–144)
Total Protein: 7.1 g/dL (ref 6.0–8.5)
eGFR: 110 mL/min/{1.73_m2} (ref >59)

## 2020-07-10 LAB — TSH+FREE T4
Free T4: 1.12 ng/dL (ref 0.82–1.77)
TSH: 2.82 u[IU]/mL (ref 0.450–4.500)

## 2020-07-10 LAB — LIPID PANEL WITH LDL/HDL RATIO
Cholesterol, Total: 177 mg/dL (ref 100–199)
HDL: 66 mg/dL (ref >39)
LDL Chol Calc (NIH): 99 mg/dL (ref 0–99)
LDL/HDL Ratio: 1.5 ratio (ref 0.0–3.2)
Triglycerides: 64 mg/dL (ref 0–149)
VLDL Cholesterol Cal: 12 mg/dL (ref 5–40)

## 2020-08-14 ENCOUNTER — Ambulatory Visit: Payer: BC Managed Care – PPO | Admitting: Hospice and Palliative Medicine

## 2020-08-22 ENCOUNTER — Other Ambulatory Visit: Payer: Self-pay | Admitting: Internal Medicine

## 2020-08-22 ENCOUNTER — Encounter: Payer: Self-pay | Admitting: Internal Medicine

## 2020-08-22 DIAGNOSIS — E038 Other specified hypothyroidism: Secondary | ICD-10-CM

## 2020-08-22 NOTE — Progress Notes (Signed)
tsh

## 2020-10-05 ENCOUNTER — Telehealth: Payer: Self-pay

## 2020-10-05 NOTE — Telephone Encounter (Signed)
Spoke to [facility] about bill for $119.60 per [facility], code z11.3 was accepted for the nuswab but pt has a deductible and this is going towards the deductible, pt can call insurance if she has questions about her plan

## 2020-10-08 ENCOUNTER — Telehealth: Payer: Self-pay

## 2020-10-08 ENCOUNTER — Other Ambulatory Visit: Payer: Self-pay | Admitting: Internal Medicine

## 2020-10-08 DIAGNOSIS — E038 Other specified hypothyroidism: Secondary | ICD-10-CM

## 2020-10-08 MED ORDER — LEVOTHYROXINE SODIUM 50 MCG PO TABS: ORAL_TABLET | ORAL | 0 refills | Status: DC

## 2020-10-08 NOTE — Telephone Encounter (Signed)
I sent her new dose, however please let the pt know that hse is due for her thyroid labs, order is sent

## 2020-10-08 NOTE — Telephone Encounter (Signed)
Spoke to pt and informed her of meds being sent in and labs due to be done, she is postponing getting lab work until the bill/claim for previous testing is resolved, resubmitted claim can take up to 30-45 days.

## 2020-10-08 NOTE — Telephone Encounter (Signed)
Spoke to [employer] again, resubmitted claim on invoice [phone] using code Z20.2, the rep explained to me that with the nuswab a panel of tests was selected by the provider and the panel cant be broken down so only covered/paid for tests are done. The nuswab was covered but not every test in panel is paid for by insurance. Pt can call insurance and appeal if resubmitted claim does not take care of the bill. Was informed by [employer] rep that insurance companies tend to accept the appeal when the pt explains to them that the pt cant choose the tests the provider has selected and pt was unaware that these test ordered would not be paid for by insurance under preventative visit.

## 2020-11-23 ENCOUNTER — Other Ambulatory Visit: Payer: Self-pay | Admitting: Internal Medicine

## 2020-11-23 DIAGNOSIS — E038 Other specified hypothyroidism: Secondary | ICD-10-CM

## 2020-11-26 ENCOUNTER — Other Ambulatory Visit: Payer: Self-pay | Admitting: Physician Assistant

## 2020-11-26 ENCOUNTER — Ambulatory Visit: Payer: BC Managed Care – PPO | Admitting: Physician Assistant

## 2020-11-26 ENCOUNTER — Other Ambulatory Visit: Payer: Self-pay

## 2020-11-26 DIAGNOSIS — R5383 Other fatigue: Secondary | ICD-10-CM

## 2020-11-26 DIAGNOSIS — F411 Generalized anxiety disorder: Secondary | ICD-10-CM | POA: Diagnosis not present

## 2020-11-26 DIAGNOSIS — E538 Deficiency of other specified B group vitamins: Secondary | ICD-10-CM

## 2020-11-26 DIAGNOSIS — E038 Other specified hypothyroidism: Secondary | ICD-10-CM | POA: Diagnosis not present

## 2020-11-26 DIAGNOSIS — K5900 Constipation, unspecified: Secondary | ICD-10-CM | POA: Diagnosis not present

## 2020-11-26 DIAGNOSIS — E559 Vitamin D deficiency, unspecified: Secondary | ICD-10-CM

## 2020-11-26 NOTE — Progress Notes (Signed)
[facility] [address] [place], [place] [address]  Internal MEDICINE  Office Visit Note  Patient Name: Cassandra Stephens  [id number]  [id number]  Date of Service: 12/02/2020  Chief Complaint  Patient presents with   Follow-up    More labs order for thyroid    Hypothyroidism   Fatigue    cons   Constipation    Bloating after eats   Anxiety    HPI Pt is here for routine follow up -Taking stool softeners daily and laxative a few times per week. If she doesn't take these it will be over a week before BM and has cramping and bloating.  -Samples of linzess 290 given (will call for script if desired) -Very fatigued and worried her hormones levels are off--will recheck labs including thyroid levels and antibodies -Work keeps her busy and she is exhausted in evening and feels like she cant be present with 2.5yo son. -Feels sick every weekend -Already cut gluten out of diet 7-48yrs ago, thinks some forms of dairy may be problematic for her. Would be interesting in seeing GI -Never took the lexapro prescribed at last visit--states she is not depressed and does not want to start a med for depression when she does not need it. Does get anxious at times. Does not take xanax bc no panic attacks anymorer. Has not taken it all. Discussed considering trying lexapro for her anxiety and explained that this medication helps with anxiety not just depression -She has an IUD, so no menstrual cycles but does get several cramps. Has been in since Feb 2020.  Current Medication: Outpatient Encounter Medications as of 11/26/2020  Medication Sig   levothyroxine (SYNTHROID) 50 MCG tablet Take one tab in am on empty stomach   Multiple Vitamins-Minerals (MULTIVITAMIN WOMEN) TABS Take by mouth.   ALPRAZolam (XANAX) 0.25 MG tablet Take 1 tablet (0.25 mg total) by mouth at bedtime as needed for anxiety. (Patient not taking: Reported on 11/26/2020)   escitalopram (LEXAPRO) 5 MG tablet Take 1 tablet (5 mg  total) by mouth daily. (Patient not taking: Reported on 11/26/2020)   [DISCONTINUED] mupirocin cream (BACTROBAN) 2 % Apply 1 application topically 2 (two) times daily. (Patient not taking: Reported on 11/26/2020)   No facility-administered encounter medications on file as of 11/26/2020.    Surgical History: Past Surgical History:  Procedure Laterality Date   TONSILECTOMY, ADENOIDECTOMY, BILATERAL MYRINGOTOMY AND TUBES     TONSILLECTOMY Bilateral     Medical History: Past Medical History:  Diagnosis Date   Anxiety    Depression    Herpes simplex virus (HSV) type I or type II DNA not detected by PCR    Thyroid disease     Family History: Family History  Problem Relation Age of Onset   Hyperlipidemia Father    Hypertension Father    Heart attack Father    Diabetes Maternal Grandfather    Diabetes Paternal Grandfather    Heart disease Paternal Grandfather     Social History   Socioeconomic History   Marital status: Married    Spouse name: Not on file   Number of children: Not on file   Years of education: Not on file   Highest education level: Not on file  Occupational History   Not on file  Tobacco Use   Smoking status: Never   Smokeless tobacco: Never  Vaping Use   Vaping Use: Never used  Substance and Sexual Activity   Alcohol use: Yes    Comment: ocassionally  Drug use: Never   Sexual activity: Not Currently    Birth control/protection: None  Other Topics Concern   Not on file  Social History Narrative   Not on file   Social Determinants of Health   Financial Resource Strain: Not on file  Food Insecurity: Not on file  Transportation Needs: Not on file  Physical Activity: Not on file  Stress: Not on file  Social Connections: Not on file  Intimate Partner Violence: Not on file      Review of Systems  Constitutional:  Positive for fatigue. Negative for chills and unexpected weight change.  HENT:  Negative for congestion, postnasal drip, rhinorrhea,  sneezing and sore throat.   Eyes:  Negative for redness.  Respiratory:  Negative for cough, chest tightness and shortness of breath.   Cardiovascular:  Negative for chest pain and palpitations.  Gastrointestinal:  Positive for abdominal distention and constipation. Negative for abdominal pain, blood in stool, diarrhea, nausea and vomiting.  Genitourinary:  Negative for dysuria and frequency.  Musculoskeletal:  Negative for arthralgias, back pain, joint swelling and neck pain.  Skin:  Negative for rash.  Neurological: Negative.  Negative for tremors and numbness.  Hematological:  Negative for adenopathy. Does not bruise/bleed easily.  Psychiatric/Behavioral:  Negative for behavioral problems (Depression), sleep disturbance and suicidal ideas. The patient is nervous/anxious.    Vital Signs: BP 110/70   Pulse 84   Temp 97.8 F (36.6 C)   Resp 16   Ht 5\' 4"  (1.626 m)   Wt 136 lb (61.7 kg)   SpO2 98%   BMI 23.34 kg/m    Physical Exam Vitals and nursing note reviewed.  Constitutional:      General: She is not in acute distress.    Appearance: She is well-developed and normal weight. She is not diaphoretic.  HENT:     Head: Normocephalic and atraumatic.     Mouth/Throat:     Pharynx: No oropharyngeal exudate.  Eyes:     Pupils: Pupils are equal, round, and reactive to light.  Neck:     Thyroid: No thyromegaly.     Vascular: No JVD.     Trachea: No tracheal deviation.  Cardiovascular:     Rate and Rhythm: Normal rate and regular rhythm.     Heart sounds: Normal heart sounds. No murmur heard.   No friction rub. No gallop.  Pulmonary:     Effort: Pulmonary effort is normal. No respiratory distress.     Breath sounds: No wheezing or rales.  Chest:     Chest wall: No tenderness.  Abdominal:     General: Bowel sounds are normal.     Palpations: Abdomen is soft.     Tenderness: There is no abdominal tenderness.  Musculoskeletal:        General: Normal range of motion.      Cervical back: Normal range of motion and neck supple.  Lymphadenopathy:     Cervical: No cervical adenopathy.  Skin:    General: Skin is warm and dry.  Neurological:     Mental Status: She is alert and oriented to person, place, and time.     Cranial Nerves: No cranial nerve deficit.  Psychiatric:        Behavior: Behavior normal.        Thought Content: Thought content normal.        Judgment: Judgment normal.       Assessment/Plan: 1. Subclinical hypothyroidism Will continue synthroid and check levels and antibodies.  May ned endocrinology referral based on symptoms and pending lab results - TSH+T4F+T3Free - Thyroid peroxidase antibody - Thyroglobulin antibody  2. Constipation, unspecified constipation type Linzess samples given, will refer to GI for further eval and management due to chronic problems with this despite medications - Ambulatory referral to Gastroenterology  3. GAD (generalized anxiety disorder) Advised to try lose dose lexapro ordered last visit to help with anxiety  4. B12 deficiency - B12 and Folate Panel  5. Vitamin D deficiency - VITAMIN D 25 Hydroxy (Vit-D Deficiency, Fractures)  6. Other fatigue - Iron, TIBC and Ferritin Panel   General Counseling: Elyse verbalizes understanding of the findings of todays visit and agrees with plan of treatment. I have discussed any further diagnostic evaluation that may be needed or ordered today. We also reviewed her medications today. she has been encouraged to call the office with any questions or concerns that should arise related to todays visit.    Orders Placed This Encounter  Procedures   TSH+T4F+T3Free   VITAMIN D 25 Hydroxy (Vit-D Deficiency, Fractures)   B12 and Folate Panel   Thyroid peroxidase antibody   Iron, TIBC and Ferritin Panel   Thyroglobulin antibody   Ambulatory referral to Gastroenterology    No orders of the defined types were placed in this encounter.   This patient was seen  by Lynn Ito, PA-C in collaboration with Dr. Beverely Risen as a part of collaborative care agreement.   Total time spent:40 Minutes Time spent includes review of chart, medications, test results, and follow up plan with the patient.      Dr Lyndon Code Internal medicine

## 2020-11-28 ENCOUNTER — Encounter: Payer: Self-pay | Admitting: *Deleted

## 2020-11-28 ENCOUNTER — Encounter: Payer: Self-pay | Admitting: Physician Assistant

## 2020-11-28 LAB — IRON,TIBC AND FERRITIN PANEL
Ferritin: 53 ng/mL (ref 15–150)
Iron Saturation: 42 % (ref 15–55)
Iron: 135 ug/dL (ref 27–159)
Total Iron Binding Capacity: 324 ug/dL (ref 250–450)
UIBC: 189 ug/dL (ref 131–425)

## 2020-11-28 LAB — TSH+T4F+T3FREE
Free T4: 1.27 ng/dL (ref 0.82–1.77)
T3, Free: 2.4 pg/mL (ref 2.0–4.4)
TSH: 1.23 u[IU]/mL (ref 0.450–4.500)

## 2020-11-28 LAB — VITAMIN D 25 HYDROXY (VIT D DEFICIENCY, FRACTURES): Vit D, 25-Hydroxy: 55.7 ng/mL (ref 30.0–100.0)

## 2020-11-28 LAB — THYROID PEROXIDASE ANTIBODY: Thyroperoxidase Ab SerPl-aCnc: 42 IU/mL — ABNORMAL HIGH (ref 0–34)

## 2020-11-28 LAB — B12 AND FOLATE PANEL
Folate: >20 ng/mL (ref >3.0)
Vitamin B-12: 741 pg/mL (ref 232–1245)

## 2020-11-28 LAB — THYROGLOBULIN ANTIBODY: Thyroglobulin Antibody: <1 IU/mL (ref 0.0–0.9)

## 2020-11-30 ENCOUNTER — Other Ambulatory Visit: Payer: Self-pay | Admitting: Physician Assistant

## 2020-11-30 DIAGNOSIS — E038 Other specified hypothyroidism: Secondary | ICD-10-CM

## 2020-11-30 DIAGNOSIS — R768 Other specified abnormal immunological findings in serum: Secondary | ICD-10-CM

## 2020-11-30 DIAGNOSIS — R5383 Other fatigue: Secondary | ICD-10-CM

## 2020-12-10 ENCOUNTER — Other Ambulatory Visit: Payer: Self-pay

## 2020-12-10 ENCOUNTER — Ambulatory Visit (INDEPENDENT_AMBULATORY_CARE_PROVIDER_SITE_OTHER): Payer: BC Managed Care – PPO | Admitting: Gastroenterology

## 2020-12-10 ENCOUNTER — Encounter: Payer: Self-pay | Admitting: Gastroenterology

## 2020-12-10 VITALS — BP 103/70 | HR 73 | Temp 97.8°F | Ht 64.0 in | Wt 139.6 lb

## 2020-12-10 DIAGNOSIS — K5900 Constipation, unspecified: Secondary | ICD-10-CM

## 2020-12-10 DIAGNOSIS — K9049 Malabsorption due to intolerance, not elsewhere classified: Secondary | ICD-10-CM | POA: Diagnosis not present

## 2020-12-10 DIAGNOSIS — K9041 Non-celiac gluten sensitivity: Secondary | ICD-10-CM | POA: Diagnosis not present

## 2020-12-10 MED ORDER — TRULANCE 3 MG PO TABS: 1.0000 | ORAL_TABLET | Freq: Every day | ORAL | 1 refills | Status: AC

## 2020-12-10 NOTE — Patient Instructions (Signed)
Referral will be placed for Allergy testing, they will call you to schedule an appointment.  Trulance samples given with coupon card to get you started.

## 2020-12-10 NOTE — Progress Notes (Signed)
Vonda Antigua [address]  [place], [place] [address]  Main: [phone]  Fax: [phone]   Gastroenterology Consultation  Referring Provider:     Mylinda Latina, PA* Primary Care Physician:  Lavera Guise, MD Reason for Consultation:     Constipation        HPI:    Chief Complaint  Patient presents with   New Patient (Initial Visit)    Pt reports ongoing issues for many years alternating diarrhea/constipation... Pt reports improvement when starting gluten free diet 8 yrs ago... Paternal GF colon polyps...    Constipation    Sx have worsened x 2-3 months.... also with abd bloating and increase in gas especially after eating... denies N/V, blood in stools... Having 2-3 BM per week only after taking OTC laxative, pt has tried milk of magnesia, Dulcolax    MYCHELE SEYLLER is a 30 y.o. y/o female referred for consultation & management  by Dr. Humphrey Rolls, Timoteo Gaul, MD. patient reports chronic history of constipation, dating back at least 7 to 8 years ago.  Remembers that this did worsen when she was pregnant, 2 years ago.  However, started eating a gluten-free diet and states that helped with her constipation and she felt better overall on the gluten-free diet.  Sometimes, she would introduce gluten into her diet to allow herself to have a bowel movement, but recently when she has been constipated she has tried this and it has not helped and has just given her a headache, but not allow her to have a bowel movement.  No blood in stool.  No weight loss.  No nausea or vomiting.  No dysphagia.  Has not had any official testing for celiac disease in the past.  She also reports fatigue, decreased energy level overall and this was evaluated by primary care provider.  PCP notes from Aug 26, 2020 reviewed and reports that patient has been taking stool softeners daily, and that samples of Linzess were given.  Patient states the Linzess samples were the high doses that is all  they had in the office, and she has stopped taking the 80s, as it was not leading to good results, with diarrhea on 1 day, but no bowel movement on 2 of those days.  PCP also notes that Lexapro was prescribed in the past, but patient stated that she is not depressed and did not want to start this medication so she did not take it.  They diagnosed her with subclinical hypothyroidism on that visit and ordered blood work for this.  Patient states she has an endocrinology referral pending as well.  Past Medical History:  Diagnosis Date   Anxiety    Depression    Herpes simplex virus (HSV) type I or type II DNA not detected by PCR    Thyroid disease     Past Surgical History:  Procedure Laterality Date   TONSILECTOMY, ADENOIDECTOMY, BILATERAL MYRINGOTOMY AND TUBES     TONSILLECTOMY Bilateral     Prior to Admission medications   Medication Sig Start Date End Date Taking? Authorizing Provider  b complex vitamins capsule Take 1 capsule by mouth daily.   Yes [provider]  Cholecalciferol (VITAMIN D3) 50 MCG (2000 UT) TABS Take by mouth.   Yes [provider]  levothyroxine (SYNTHROID) 50 MCG tablet Take one tab in am on empty stomach 10/08/20  Yes Lavera Guise, MD  Multiple Vitamins-Minerals (MULTIVITAMIN WOMEN) TABS Take by mouth.   Yes  [provider]  Plecanatide (TRULANCE) 3 MG TABS Take 1 tablet by mouth daily. 12/10/20 03/10/21 Yes Virgel Manifold, MD  Probiotic Product (PROBIOTIC DAILY) CAPS Take by mouth.   Yes [provider]  ALPRAZolam (XANAX) 0.25 MG tablet Take 1 tablet (0.25 mg total) by mouth at bedtime as needed for anxiety. Patient not taking: No sig reported 07/02/20   Luiz Ochoa, NP  escitalopram (LEXAPRO) 5 MG tablet Take 1 tablet (5 mg total) by mouth daily. Patient not taking: No sig reported 07/02/20   Luiz Ochoa, NP    Family History  Problem Relation Age of Onset   Hyperlipidemia Father    Hypertension Father     Heart attack Father    Diabetes Maternal Grandfather    Diabetes Paternal Grandfather    Heart disease Paternal Grandfather      Social History   Tobacco Use   Smoking status: Never   Smokeless tobacco: Never  Vaping Use   Vaping Use: Never used  Substance Use Topics   Alcohol use: Yes    Comment: ocassionally   Drug use: Never    Allergies as of 12/10/2020   (No Known Allergies)    Review of Systems:    All systems reviewed and negative except where noted in HPI.   Physical Exam:  Constitutional: General:   Alert,  Well-developed, well-nourished, pleasant and cooperative in NAD BP 103/70   Pulse 73   Temp 97.8 F (36.6 C) (Oral)   Ht _0  (1.626 m)   Wt 139 lb 9.6 oz (63.3 kg)   BMI 23.96 kg/m   Eyes:  Sclera clear, no icterus.   Conjunctiva pink. PERRLA  Ears:  No scars, lesions or masses, Normal auditory acuity. Nose:  No deformity, discharge, or lesions. Mouth:  No deformity or lesions, oropharynx pink & moist.  Neck:  Supple; no masses or thyromegaly.  Respiratory: Normal respiratory effort, Normal percussion  Gastrointestinal: Soft, non-tender and non-distended without masses, hepatosplenomegaly or hernias noted.  No guarding or rebound tenderness.     Cardiac: No clubbing or edema.  No cyanosis. Normal posterior tibial pedal pulses noted.  Lymphatic:  No significant cervical or axillary adenopathy.  Psych:  Alert and cooperative. Normal mood and affect.  Musculoskeletal:  Normal gait. Head normocephalic, atraumatic. Symmetrical without gross deformities. 5/5 Upper and Lower extremity strength bilaterally.  Skin: Warm. Intact without significant lesions or rashes. No jaundice.  Neurologic:  Face symmetrical, tongue midline, Normal sensation to touch;  grossly normal neurologically.  Psych:  Alert and oriented x3, Alert and cooperative. Normal mood and affect.   Labs: CBC    Component Value Date/Time   WBC 4.3 07/09/2020 0909   WBC 16.8 (H)  04/01/2018 0527   RBC 4.47 07/09/2020 0909   RBC 2.90 (L) 04/01/2018 0527   HGB 13.5 07/09/2020 0909   HGB 13.1 07/31/2017 0000   HCT 40.8 07/09/2020 0909   HCT 40 07/31/2017 0000   PLT 280 07/09/2020 0909   PLT 240 07/31/2017 0000   MCV 91 07/09/2020 0909   MCH 30.2 07/09/2020 0909   MCH 30.7 04/01/2018 0527   MCHC 33.1 07/09/2020 0909   MCHC 32.7 04/01/2018 0527   RDW 12.3 07/09/2020 0909   LYMPHSABS 1.6 07/09/2020 0909   EOSABS 0.2 07/09/2020 0909   BASOSABS 0.0 07/09/2020 0909   CMP     Component Value Date/Time   NA 140 07/09/2020 0909   K 5.1 07/09/2020 0909   CL 102  07/09/2020 0909   CO2 20 07/09/2020 0909   GLUCOSE 88 07/09/2020 0909   GLUCOSE 80 12/23/2017 1142   BUN 18 07/09/2020 0909   CREATININE 0.76 07/09/2020 0909   CALCIUM 9.5 07/09/2020 0909   PROT 7.1 07/09/2020 0909   ALBUMIN 5.0 07/09/2020 0909   AST 19 07/09/2020 0909   ALT 18 07/09/2020 0909   ALKPHOS 43 (L) 07/09/2020 0909   BILITOT 0.3 07/09/2020 0909   GFRNONAA >60 12/23/2017 1142   GFRAA >60 12/23/2017 1142   TSH 1.2 Free T4 1.2 B12 741 Folate over 20 Ferritin 53 Iron saturation 42%  Imaging Studies: No results found.  Assessment and Plan:   STORI ROYSE is a 30 y.o. y/o female has been referred for constipation  Patient does not have any alarm symptoms at this time.  With no weight loss, and never had blood in her stool.  She has chronically had issues with bowel movements, and has been mostly constipated throughout her life, with some improvement in symptoms with a gluten-free diet.  We did discuss that typically celiac disease or gluten sensitivity causes diarrhea, but patient insist that she felt better on a gluten-free diet overall.  She has been avoiding gluten for the most part.  However, since she has never had testing for celiac disease, will obtain TTG IgA and IgG.  However, this can be false negative and patient not on gluten diet.  If this is negative, can consider HLA  testing to confirm the diagnosis.  She does need treatment for constipation and has tried MiraLAX before without good results and other over-the-counter stool softeners.  She is not taking Linzess.  Trulance is considered formulary for her as per epic.  Therefore, will prescribe this and reassess symptoms  Her labs are very reassuring with no anemia, no elevation in liver enzymes, and normal electrolytes as per above, with normal ferritin, iron, TSH levels as well  I encouraged her to follow-up with endocrinology given her fatigue, not explained by testing so far as she was referred to them by PCP  No indication for endoscopic interventions at this time  High-fiber diet discussed  Patient questions if she has food sensitivities or food intolerance.  She does not have any specific symptoms of lactose intolerance.  She does not have any specific allergic symptoms to foods per se, but skin prick testing may help.  Allergy nausea referral placed for the same.  Dr Vonda Antigua  Speech recognition software was used to dictate the above note.

## 2020-12-11 ENCOUNTER — Telehealth: Payer: Self-pay

## 2020-12-11 NOTE — Telephone Encounter (Signed)
Received denial from [facility] endocrinology for referral sent. Sent message to Lauren [employer] [id number]Toni

## 2020-12-12 LAB — TISSUE TRANSGLUTAMINASE ABS,IGG,IGA
Tissue Transglut Ab: <2 U/mL (ref 0–5)
Transglutaminase IgA: <2 U/mL (ref 0–3)

## 2020-12-20 NOTE — Addendum Note (Signed)
Addended by: Melodie Bouillon on: 12/20/2020 12:38 PM   Modules accepted: Orders

## 2020-12-21 ENCOUNTER — Encounter: Payer: Self-pay | Admitting: Gastroenterology

## 2021-01-02 ENCOUNTER — Ambulatory Visit: Payer: Self-pay | Admitting: Nurse Practitioner

## 2021-01-07 ENCOUNTER — Ambulatory Visit: Payer: Self-pay | Admitting: Allergy

## 2021-01-09 ENCOUNTER — Other Ambulatory Visit: Payer: Self-pay | Admitting: Internal Medicine

## 2021-01-09 DIAGNOSIS — E038 Other specified hypothyroidism: Secondary | ICD-10-CM

## 2021-01-16 DIAGNOSIS — Z03818 Encounter for observation for suspected exposure to other biological agents ruled out: Secondary | ICD-10-CM | POA: Diagnosis not present

## 2021-01-16 DIAGNOSIS — Z20822 Contact with and (suspected) exposure to covid-19: Secondary | ICD-10-CM | POA: Diagnosis not present

## 2021-02-01 ENCOUNTER — Telehealth: Payer: Self-pay

## 2021-02-01 NOTE — Telephone Encounter (Signed)
Patient was referred out to GI and to Endo to further eval her concerns. Per patient due to her financial constraints she has cancelled all of her appointments at this time.

## 2021-02-26 ENCOUNTER — Ambulatory Visit: Payer: BC Managed Care – PPO | Admitting: Internal Medicine

## 2021-03-12 ENCOUNTER — Encounter: Payer: Self-pay | Admitting: Internal Medicine

## 2021-03-13 ENCOUNTER — Encounter: Payer: Self-pay | Admitting: Nurse Practitioner

## 2021-03-13 ENCOUNTER — Other Ambulatory Visit: Payer: Self-pay

## 2021-03-13 ENCOUNTER — Ambulatory Visit: Payer: BC Managed Care – PPO | Admitting: Gastroenterology

## 2021-03-13 ENCOUNTER — Telehealth: Payer: BC Managed Care – PPO | Admitting: Nurse Practitioner

## 2021-03-13 VITALS — Temp 98.0°F | Resp 16 | Ht 64.0 in | Wt 138.0 lb

## 2021-03-13 DIAGNOSIS — B3731 Acute candidiasis of vulva and vagina: Secondary | ICD-10-CM

## 2021-03-13 DIAGNOSIS — J018 Other acute sinusitis: Secondary | ICD-10-CM

## 2021-03-13 DIAGNOSIS — G44209 Tension-type headache, unspecified, not intractable: Secondary | ICD-10-CM | POA: Diagnosis not present

## 2021-03-13 MED ORDER — FLUCONAZOLE 150 MG PO TABS
150.0000 mg | ORAL_TABLET | Freq: Once | ORAL | 0 refills | Status: AC
Start: 2021-03-13 — End: 2021-03-13

## 2021-03-13 MED ORDER — AMOXICILLIN-POT CLAVULANATE 875-125 MG PO TABS: 1.0000 | ORAL_TABLET | Freq: Two times a day (BID) | ORAL | 0 refills | Status: DC

## 2021-03-13 NOTE — Progress Notes (Signed)
[facility] [address] [place], [place] [address]  Internal MEDICINE  Telephone Visit  Patient Name: Cassandra Stephens  [id number]  [id number]  Date of Service: 03/13/2021  I connected with the patient at 11:35 AM by telephone and verified the patients identity using two identifiers.   I discussed the limitations, risks, security and privacy concerns of performing an evaluation and management service by telephone and the availability of in person appointments. I also discussed with the patient that there may be a patient responsible charge related to the service.  The patient expressed understanding and agrees to proceed.    Chief Complaint  Patient presents with   Acute Visit   Telephone Assessment    [phone]   Telephone Screen    Phone call preferred    Headache   Sinusitis    Noticed about 2 to 3 weeks ago but has gotten worse recently     HPI Cassandra Stephens presents for a telehealth virtual visit for symptoms of sinusitis. She reports a headache, runny nose, sinus pain/pressure, nasal congestion and sore throat. She denies fever, chills, fatigue, body aches, cough, SOB, wheezing, or chest tightness.    Current Medication: Outpatient Encounter Medications as of 03/13/2021  Medication Sig   ALPRAZolam (XANAX) 0.25 MG tablet Take 1 tablet (0.25 mg total) by mouth at bedtime as needed for anxiety.   amoxicillin-clavulanate (AUGMENTIN) 875-125 MG tablet Take 1 tablet by mouth 2 (two) times daily.   b complex vitamins capsule Take 1 capsule by mouth daily.   Cholecalciferol (VITAMIN D3) 50 MCG (2000 UT) TABS Take by mouth.   fluconazole (DIFLUCAN) 150 MG tablet Take 1 tablet (150 mg total) by mouth once for 1 dose. May take an additional dose x2 if still symptomatic after 3 days   levothyroxine (SYNTHROID) 50 MCG tablet TAKE ONE TAB IN AM ON EMPTY STOMACH   Multiple Vitamins-Minerals (MULTIVITAMIN WOMEN) TABS Take by mouth.   Probiotic Product (PROBIOTIC DAILY) CAPS  Take by mouth.   [DISCONTINUED] escitalopram (LEXAPRO) 5 MG tablet Take 1 tablet (5 mg total) by mouth daily. (Patient not taking: No sig reported)   No facility-administered encounter medications on file as of 03/13/2021.    Surgical History: Past Surgical History:  Procedure Laterality Date   TONSILECTOMY, ADENOIDECTOMY, BILATERAL MYRINGOTOMY AND TUBES     TONSILLECTOMY Bilateral     Medical History: Past Medical History:  Diagnosis Date   Anxiety    Depression    Herpes simplex virus (HSV) type I or type II DNA not detected by PCR    Thyroid disease     Family History: Family History  Problem Relation Age of Onset   Hyperlipidemia Father    Hypertension Father    Heart attack Father    Diabetes Maternal Grandfather    Diabetes Paternal Grandfather    Heart disease Paternal Grandfather     Social History   Socioeconomic History   Marital status: Married    Spouse name: Not on file   Number of children: Not on file   Years of education: Not on file   Highest education level: Not on file  Occupational History   Not on file  Tobacco Use   Smoking status: Never   Smokeless tobacco: Never  Vaping Use   Vaping Use: Never used  Substance and Sexual Activity   Alcohol use: Yes    Comment: ocassionally   Drug use: Never   Sexual activity: Not Currently    Birth control/protection: None  Other Topics Concern   Not on file  Social History Narrative   Not on file   Social Determinants of Health   Financial Resource Strain: Not on file  Food Insecurity: Not on file  Transportation Needs: Not on file  Physical Activity: Not on file  Stress: Not on file  Social Connections: Not on file  Intimate Partner Violence: Not on file      Review of Systems  Constitutional:  Negative for chills, fatigue and fever.  HENT:  Positive for congestion, rhinorrhea, sinus pressure, sinus pain and sore throat. Negative for ear pain, postnasal drip and sneezing.   Eyes:  Negative.  Negative for pain.  Respiratory:  Negative for cough, chest tightness, shortness of breath and wheezing.   Cardiovascular: Negative.  Negative for chest pain and palpitations.  Gastrointestinal: Negative.  Negative for abdominal pain, constipation, diarrhea, nausea and vomiting.  Skin:  Negative for rash.  Neurological:  Positive for headaches. Negative for dizziness and light-headedness.   Vital Signs: Temp 98 F (36.7 C)   Resp 16   Ht 5\' 4"  (1.626 m)   Wt 138 lb (62.6 kg)   BMI 23.69 kg/m    Observation/Objective: She is alert and oriented and engages in conversation appropriately. She does not sound like she is in any distress over telephone call.     Assessment/Plan: 1. Acute non-recurrent sinusitis of other sinus Empiric antibiotic treatment prescribed.  - amoxicillin-clavulanate (AUGMENTIN) 875-125 MG tablet; Take 1 tablet by mouth 2 (two) times daily.  Dispense: 20 tablet; Refill: 0  2. Acute non intractable tension-type headache Patient may use desired OTC medication for headache.   3. Vulvovaginal candidiasis Empiric treatment prescribed in case the antibiotic causes a yeast infection.  - fluconazole (DIFLUCAN) 150 MG tablet; Take 1 tablet (150 mg total) by mouth once for 1 dose. May take an additional dose x2 if still symptomatic after 3 days  Dispense: 3 tablet; Refill: 0   General Counseling: Cassandra Stephens verbalizes understanding of the findings of today's phone visit and agrees with plan of treatment. I have discussed any further diagnostic evaluation that may be needed or ordered today. We also reviewed her medications today. she has been encouraged to call the office with any questions or concerns that should arise related to todays visit.  Return if symptoms worsen or fail to improve.   No orders of the defined types were placed in this encounter.   Meds ordered this encounter  Medications   amoxicillin-clavulanate (AUGMENTIN) 875-125 MG tablet     Sig: Take 1 tablet by mouth 2 (two) times daily.    Dispense:  20 tablet    Refill:  0   fluconazole (DIFLUCAN) 150 MG tablet    Sig: Take 1 tablet (150 mg total) by mouth once for 1 dose. May take an additional dose x2 if still symptomatic after 3 days    Dispense:  3 tablet    Refill:  0     Time spent:10 Minutes Time spent with patient included reviewing progress notes, labs, imaging studies, and discussing plan for follow up.  [place] Controlled Substance Database was reviewed by me for overdose risk score (ORS) if appropriate.  This patient was seen by Dahlia Client, FNP-C in collaboration with Dr. Sallyanne Kuster as a part of collaborative care agreement.  Cassandra Isip R. Beverely Risen, MSN, FNP-C Internal medicine

## 2021-04-08 ENCOUNTER — Other Ambulatory Visit: Payer: Self-pay | Admitting: Internal Medicine

## 2021-04-08 DIAGNOSIS — E038 Other specified hypothyroidism: Secondary | ICD-10-CM

## 2021-05-02 ENCOUNTER — Telehealth: Payer: BC Managed Care – PPO | Admitting: Physician Assistant

## 2021-05-02 DIAGNOSIS — B9689 Other specified bacterial agents as the cause of diseases classified elsewhere: Secondary | ICD-10-CM | POA: Diagnosis not present

## 2021-05-02 DIAGNOSIS — J019 Acute sinusitis, unspecified: Secondary | ICD-10-CM | POA: Diagnosis not present

## 2021-05-02 DIAGNOSIS — Z03818 Encounter for observation for suspected exposure to other biological agents ruled out: Secondary | ICD-10-CM | POA: Diagnosis not present

## 2021-05-02 DIAGNOSIS — Z20822 Contact with and (suspected) exposure to covid-19: Secondary | ICD-10-CM | POA: Diagnosis not present

## 2021-05-02 MED ORDER — DOXYCYCLINE HYCLATE 100 MG PO TABS: 100.0000 mg | ORAL_TABLET | Freq: Two times a day (BID) | ORAL | 0 refills | Status: DC

## 2021-05-02 NOTE — Progress Notes (Signed)

## 2021-05-02 NOTE — Progress Notes (Signed)
I have spent 5 minutes in review of e-visit questionnaire, review and updating patient chart, medical decision making and response to patient.   Marcy Sookdeo Cody Rollo Farquhar, PA-C    

## 2021-05-29 ENCOUNTER — Other Ambulatory Visit: Payer: Self-pay

## 2021-05-29 ENCOUNTER — Encounter: Payer: Self-pay | Admitting: Obstetrics

## 2021-05-29 ENCOUNTER — Ambulatory Visit (INDEPENDENT_AMBULATORY_CARE_PROVIDER_SITE_OTHER): Payer: BC Managed Care – PPO | Admitting: Obstetrics

## 2021-05-29 VITALS — BP 99/67 | HR 75 | Resp 16 | Ht 64.0 in | Wt 140.7 lb

## 2021-05-29 DIAGNOSIS — Z01419 Encounter for gynecological examination (general) (routine) without abnormal findings: Secondary | ICD-10-CM

## 2021-05-29 DIAGNOSIS — Z30432 Encounter for removal of intrauterine contraceptive device: Secondary | ICD-10-CM | POA: Diagnosis not present

## 2021-05-29 NOTE — Progress Notes (Addendum)
SUBJECTIVE  HPI  Cassandra Stephens is a 31 y.o.-year-old female who presents for an annual physical today. She would like her IUD removed. She has had it in for about 3 years and started experiencing some pain and clots this month. She also reports a lack of libido. She has dealt with anxiety and depression in the past but feels it is well-managed currently.   Medical/Surgical History Past Medical History:  Diagnosis Date   Anxiety    Depression    Herpes simplex virus (HSV) type I or type II DNA not detected by PCR    Thyroid disease    Past Surgical History:  Procedure Laterality Date   TONSILECTOMY, ADENOIDECTOMY, BILATERAL MYRINGOTOMY AND TUBES     TONSILLECTOMY Bilateral     Social History Lives with husband and child Works full time Exercise: daily Substances: Denies tobacco, vaping, and recreational drugs. Occasional glass of wine.  Obstetric History OB History     Gravida  1   Para  1   Term  1   Preterm  0   AB  0   Living  1      SAB  0   IAB  0   Ectopic  0   Multiple  0   Live Births  1            GYN/Menstrual History No LMP recorded. (Menstrual status: IUD). Has not had a period in approximately 7 years d/t IUD and pregnancy Last Pap: 3/22. Normal, negative HPV Contraception: IUD  Prevention Pap due 06/2023 Mammogram: at 42   Current Medications Outpatient Medications Prior to Visit  Medication Sig   ALPRAZolam (XANAX) 0.25 MG tablet Take 1 tablet (0.25 mg total) by mouth at bedtime as needed for anxiety.   b complex vitamins capsule Take 1 capsule by mouth daily.   Cholecalciferol (VITAMIN D3) 50 MCG (2000 UT) TABS Take by mouth.   levothyroxine (SYNTHROID) 50 MCG tablet TAKE ONE TAB IN AM ON EMPTY STOMACH   Multiple Vitamins-Minerals (MULTIVITAMIN WOMEN) TABS Take by mouth.   Probiotic Product (PROBIOTIC DAILY) CAPS Take by mouth.   doxycycline (VIBRA-TABS) 100 MG tablet Take 1 tablet (100 mg total) by mouth 2 (two) times  daily. (Patient not taking: Reported on 05/29/2021)   No facility-administered medications prior to visit.      Upstream - 05/29/21 1040       Pregnancy Intention Screening   Does the patient want to become pregnant in the next year? No    Does the patient's partner want to become pregnant in the next year? No    Would the patient like to discuss contraceptive options today? Yes      Contraception Wrap Up   Current Method IUD or IUS    End Method FAM or LAM;Female Condom    Contraception Counseling Provided Yes            The pregnancy intention screening data noted above was reviewed. Potential methods of contraception were discussed. The patient elected to proceed with FAM or LAM; Female Condom.   ROS History obtained from the patient General ROS: negative Psychological ROS: negative for - anxiety or depression Occasional panic attacks Endocrine ROS: negative Breast ROS: negative for breast lumps Respiratory ROS: no cough, shortness of breath, or wheezing Cardiovascular ROS: no chest pain or dyspnea on exertion Gastrointestinal ROS: no abdominal pain, change in bowel habits, or black or bloody stools Genito-Urinary ROS: no dysuria, trouble voiding, or hematuria Positive for cramping  and blood clots.  Depression screen [employer] 2/9 05/29/2021 11/26/2020 07/02/2020 01/06/2020 10/13/2019  Decreased Interest 0 0 0 0 0  Down, Depressed, Hopeless 0 0 0 0 0  PHQ - 2 Score 0 0 0 0 0  Altered sleeping - - - - -  Tired, decreased energy - - - - -  Change in appetite - - - - -  Feeling bad or failure about yourself  - - - - -  Trouble concentrating - - - - -  Moving slowly or fidgety/restless - - - - -  Suicidal thoughts - - - - -  PHQ-9 Score - - - - -     OBJECTIVE  Last Weight  Most recent update: 05/29/2021  8:54 AM    Weight  63.8 kg (140 lb 11.2 oz)             Body mass index is 24.15 kg/m.    BP 99/67    Pulse 75    Resp 16    Ht 5\' 4"  (1.626 m)    Wt 140 lb 11.2 oz  (63.8 kg)    BMI 24.15 kg/m   General appearance: alert, cooperative, and appears stated age Head: Normocephalic, without obvious abnormality, atraumatic Eyes: negative findings: lids and lashes normal and conjunctivae and sclerae normal Neck: no adenopathy, supple, symmetrical, trachea midline, and thyroid not enlarged, symmetric, no tenderness/mass/nodules Lungs: clear to auscultation bilaterally Breasts: normal appearance, no masses or tenderness, No nipple retraction or dimpling, No axillary or supraclavicular adenopathy, Normal to palpation without dominant masses Heart: regular rate and rhythm, S1, S2 normal, no murmur, click, rub or gallop Abdomen: soft, non-tender; bowel sounds normal; no masses,  no organomegaly Pelvic: cervix normal in appearance, external genitalia normal, and vagina normal without discharge. IUD string visible. Grasped strings with ring forceps and provided traction but was unable to remove. IUD successfully removed without complication by Philip Aspen, CNM. Device intact. Bleeding minimal.  Extremities: extremities normal, atraumatic, no cyanosis or edema Pulses: 2+ and symmetric Skin: Skin color, texture, turgor normal. No rashes or lesions Lymph nodes: Cervical, supraclavicular, and axillary nodes normal.  ASSESSMENT  1) Annual exam 2) Desires IUD removal 3) Low libido 4) Desires contraceptive counseling  PLAN 1) Physical exam as noted. Declines labs and STI testing today. 2) IUD removed. Reviewed post-procedure care. 3) Reviewed lifestyle and ways to improve sex drive. Scheduling time for sex, seeking assistance with household needs, therapy.  4) Discussed various types of contraception. Ananiah and her partner strongly want to avoid pregnancy. She prefers non-hormonal contraception, and condoms have worked well in the past. Reviewed charting her cycle to prevent pregnancy.  Return for annual in one year or as needed for concerns.  Lloyd Huger, CNM

## 2021-06-02 ENCOUNTER — Telehealth: Payer: BC Managed Care – PPO | Admitting: Family

## 2021-06-02 DIAGNOSIS — J069 Acute upper respiratory infection, unspecified: Secondary | ICD-10-CM | POA: Diagnosis not present

## 2021-06-02 MED ORDER — FLUTICASONE PROPIONATE 50 MCG/ACT NA SUSP: 2.0000 | Freq: Every day | NASAL | 6 refills | Status: DC

## 2021-06-02 MED ORDER — BENZONATATE 100 MG PO CAPS: 100.0000 mg | ORAL_CAPSULE | Freq: Three times a day (TID) | ORAL | 0 refills | Status: DC | PRN

## 2021-06-02 MED ORDER — PREDNISONE 10 MG (21) PO TBPK: ORAL_TABLET | ORAL | 0 refills | Status: DC

## 2021-06-02 MED ORDER — PROMETHAZINE-DM 6.25-15 MG/5ML PO SYRP: 5.0000 mL | ORAL_SOLUTION | Freq: Three times a day (TID) | ORAL | 0 refills | Status: DC | PRN

## 2021-06-02 NOTE — Progress Notes (Signed)

## 2021-06-02 NOTE — Addendum Note (Signed)
Addended by: Evelina Dun A on: 06/02/2021 03:45 PM   Modules accepted: Orders

## 2021-06-11 DIAGNOSIS — Z03818 Encounter for observation for suspected exposure to other biological agents ruled out: Secondary | ICD-10-CM | POA: Diagnosis not present

## 2021-06-11 DIAGNOSIS — Z20822 Contact with and (suspected) exposure to covid-19: Secondary | ICD-10-CM | POA: Diagnosis not present

## 2021-06-14 ENCOUNTER — Telehealth (INDEPENDENT_AMBULATORY_CARE_PROVIDER_SITE_OTHER): Payer: BC Managed Care – PPO | Admitting: Physician Assistant

## 2021-06-14 VITALS — Resp 16 | Ht 64.0 in

## 2021-06-14 DIAGNOSIS — J0100 Acute maxillary sinusitis, unspecified: Secondary | ICD-10-CM | POA: Diagnosis not present

## 2021-06-14 MED ORDER — AMOXICILLIN-POT CLAVULANATE 875-125 MG PO TABS: 1.0000 | ORAL_TABLET | Freq: Two times a day (BID) | ORAL | 0 refills | Status: DC

## 2021-06-14 NOTE — Progress Notes (Signed)
[facility] ?[address] ?[place], [place] [address] ? ?Internal MEDICINE  ?Telephone Visit ? ?Patient Name: Cassandra Stephens ? [id number]  ?[id number] ? ?Date of Service: 06/14/2021 ? ?I connected with the patient at 11:41 by telephone and verified the patients identity using two identifiers.   ?I discussed the limitations, risks, security and privacy concerns of performing an evaluation and management service by telephone and the availability of in person appointments. I also discussed with the patient that there may be a patient responsible charge related to the service.  The patient expressed understanding and agrees to proceed.   ? ?Chief Complaint  ?Patient presents with  ? Telephone Screen  ? Telephone Assessment  ?  [phone]  ? Sinusitis  ?  Head congestion and cough, started 2-3 weeks ago - was prescribed promethazine and prednisone but ended up getting worse - now trying mucinex and flonase - tested negative for covid x3, flu and RSV  ? ? ?HPI ?Pt is here for virtual sick visit ?-She has been experiencing head congestion and cough for the past 2-3 weeks. She had an acute evisit for this on 2/19 and was prescribed prednisone and promethazine, but this did not help and she is feeling worse. States initially congestion in head and after prednisone improved some, but then felt more in her chest and now is back in her head again with sinus pressure. ?-She is currently taking mucinex and flonase ?-Cough has been productive, now having increased sinus pressure. Lost taste/smell but did multiple tests to make sure not covid. ?-she tested negative for covid, RSV and flu ?-Denies SOB, wheezing, fever, or chills ? ?Current Medication: ?Outpatient Encounter Medications as of 06/14/2021  ?Medication Sig  ? ALPRAZolam (XANAX) 0.25 MG tablet Take 1 tablet (0.25 mg total) by mouth at bedtime as needed for anxiety.  ? amoxicillin-clavulanate (AUGMENTIN) 875-125 MG tablet Take 1 tablet by mouth 2 (two) times  daily. Take with food.  ? b complex vitamins capsule Take 1 capsule by mouth daily.  ? benzonatate (TESSALON PERLES) 100 MG capsule Take 1 capsule (100 mg total) by mouth 3 (three) times daily as needed.  ? Cholecalciferol (VITAMIN D3) 50 MCG (2000 UT) TABS Take by mouth.  ? fluticasone (FLONASE) 50 MCG/ACT nasal spray Place 2 sprays into both nostrils daily.  ? levothyroxine (SYNTHROID) 50 MCG tablet TAKE ONE TAB IN AM ON EMPTY STOMACH  ? Multiple Vitamins-Minerals (MULTIVITAMIN WOMEN) TABS Take by mouth.  ? predniSONE (STERAPRED UNI-PAK 21 TAB) 10 MG (21) TBPK tablet Use as directed  ? Probiotic Product (PROBIOTIC DAILY) CAPS Take by mouth.  ? promethazine-dextromethorphan (PROMETHAZINE-DM) 6.25-15 MG/5ML syrup Take 5 mLs by mouth 3 (three) times daily as needed for cough.  ? [DISCONTINUED] doxycycline (VIBRA-TABS) 100 MG tablet Take 1 tablet (100 mg total) by mouth 2 (two) times daily.  ? ?No facility-administered encounter medications on file as of 06/14/2021.  ? ? ?Surgical History: ?Past Surgical History:  ?Procedure Laterality Date  ? TONSILECTOMY, ADENOIDECTOMY, BILATERAL MYRINGOTOMY AND TUBES    ? TONSILLECTOMY Bilateral   ? ? ?Medical History: ?Past Medical History:  ?Diagnosis Date  ? Anxiety   ? Depression   ? Herpes simplex virus (HSV) type I or type II DNA not detected by PCR   ? Thyroid disease   ? ? ?Family History: ?Family History  ?Problem Relation Age of Onset  ? Hyperlipidemia Father   ? Hypertension Father   ? Heart attack Father   ? Diabetes Maternal Grandfather   ?  Diabetes Paternal Grandfather   ? Heart disease Paternal Grandfather   ? ? ?Social History  ? ?Socioeconomic History  ? Marital status: Married  ?  Spouse name: Not on file  ? Number of children: Not on file  ? Years of education: Not on file  ? Highest education level: Not on file  ?Occupational History  ? Not on file  ?Tobacco Use  ? Smoking status: Never  ? Smokeless tobacco: Never  ?Vaping Use  ? Vaping Use: Never used  ?Substance  and Sexual Activity  ? Alcohol use: Yes  ?  Comment: ocassionally  ? Drug use: Never  ? Sexual activity: Not Currently  ?  Birth control/protection: None  ?Other Topics Concern  ? Not on file  ?Social History Narrative  ? Not on file  ? ?Social Determinants of Health  ? ?Financial Resource Strain: Not on file  ?Food Insecurity: Not on file  ?Transportation Needs: Not on file  ?Physical Activity: Not on file  ?Stress: Not on file  ?Social Connections: Not on file  ?Intimate Partner Violence: Not on file  ? ? ? ? ?Review of Systems  ?Constitutional:  Negative for fatigue and fever.  ?HENT:  Positive for congestion, postnasal drip and sinus pressure. Negative for mouth sores.   ?Respiratory:  Positive for cough. Negative for shortness of breath and wheezing.   ?Cardiovascular:  Negative for chest pain.  ?Genitourinary:  Negative for flank pain.  ?Psychiatric/Behavioral: Negative.    ? ?Vital Signs: ?Resp 16   Ht 5\' 4"  (1.626 m)   BMI 24.15 kg/m?  ? ? ?Observation/Objective: ? ? ?Pt is able to carry out conversation ? ?Assessment/Plan: ?1. Acute non-recurrent maxillary sinusitis ?Will start on augmentin BID with food. Continue mucinex and flonase and tylenol as needed. Advised to stay well hydrated. ?- amoxicillin-clavulanate (AUGMENTIN) 875-125 MG tablet; Take 1 tablet by mouth 2 (two) times daily. Take with food.  Dispense: 20 tablet; Refill: 0 ? ? ? ?General Counseling: gisselle antinori understanding of the findings of today's phone visit and agrees with plan of treatment. I have discussed any further diagnostic evaluation that may be needed or ordered today. We also reviewed her medications today. she has been encouraged to call the office with any questions or concerns that should arise related to todays visit. ? ? ? ?No orders of the defined types were placed in this encounter. ? ? ?Meds ordered this encounter  ?Medications  ? amoxicillin-clavulanate (AUGMENTIN) 875-125 MG tablet  ?  Sig: Take 1 tablet by mouth  2 (two) times daily. Take with food.  ?  Dispense:  20 tablet  ?  Refill:  0  ? ? ?Time spent:25 Minutes ? ? ? ?Dr Lavera Guise ?Internal medicine  ?

## 2021-07-01 ENCOUNTER — Encounter: Payer: Self-pay | Admitting: Nurse Practitioner

## 2021-07-02 ENCOUNTER — Other Ambulatory Visit: Payer: Self-pay | Admitting: Nurse Practitioner

## 2021-07-02 ENCOUNTER — Encounter: Payer: BC Managed Care – PPO | Admitting: Nurse Practitioner

## 2021-07-02 DIAGNOSIS — E782 Mixed hyperlipidemia: Secondary | ICD-10-CM

## 2021-07-02 DIAGNOSIS — E038 Other specified hypothyroidism: Secondary | ICD-10-CM

## 2021-07-02 DIAGNOSIS — E559 Vitamin D deficiency, unspecified: Secondary | ICD-10-CM

## 2021-07-02 DIAGNOSIS — D649 Anemia, unspecified: Secondary | ICD-10-CM

## 2021-07-02 DIAGNOSIS — E538 Deficiency of other specified B group vitamins: Secondary | ICD-10-CM

## 2021-07-05 ENCOUNTER — Other Ambulatory Visit: Payer: Self-pay

## 2021-07-05 ENCOUNTER — Ambulatory Visit (INDEPENDENT_AMBULATORY_CARE_PROVIDER_SITE_OTHER): Payer: BC Managed Care – PPO | Admitting: Nurse Practitioner

## 2021-07-05 ENCOUNTER — Encounter: Payer: Self-pay | Admitting: Nurse Practitioner

## 2021-07-05 VITALS — BP 102/60 | HR 76 | Temp 98.7°F | Resp 16 | Ht 64.0 in | Wt 141.8 lb

## 2021-07-05 DIAGNOSIS — E538 Deficiency of other specified B group vitamins: Secondary | ICD-10-CM | POA: Diagnosis not present

## 2021-07-05 DIAGNOSIS — F411 Generalized anxiety disorder: Secondary | ICD-10-CM

## 2021-07-05 DIAGNOSIS — E559 Vitamin D deficiency, unspecified: Secondary | ICD-10-CM

## 2021-07-05 DIAGNOSIS — E039 Hypothyroidism, unspecified: Secondary | ICD-10-CM | POA: Diagnosis not present

## 2021-07-05 DIAGNOSIS — Z0001 Encounter for general adult medical examination with abnormal findings: Secondary | ICD-10-CM | POA: Diagnosis not present

## 2021-07-05 DIAGNOSIS — E782 Mixed hyperlipidemia: Secondary | ICD-10-CM

## 2021-07-05 DIAGNOSIS — R300 Dysuria: Secondary | ICD-10-CM | POA: Diagnosis not present

## 2021-07-05 MED ORDER — ALPRAZOLAM 0.25 MG PO TABS: 0.2500 mg | ORAL_TABLET | Freq: Every evening | ORAL | 1 refills | Status: DC | PRN

## 2021-07-05 MED ORDER — LEVOTHYROXINE SODIUM 50 MCG PO TABS: ORAL_TABLET | ORAL | 1 refills | Status: DC

## 2021-07-05 NOTE — Progress Notes (Signed)
[facility] Stephens ?[address] ?[place], [place] [address] ? ?Internal MEDICINE  ?Office Visit Note ? ?Patient Name: Cassandra Stephens ? [id number]  ?[id number] ? ?Date of Service: 07/05/2021 ? ?Chief Complaint  ?Patient presents with  ? Annual Exam  ? Anxiety  ? Depression  ? ? ?HPI ?Cassandra Stephens presents for an annual well visit and physical exam.  Cassandra Stephens is a well-appearing 31 year old female with hypothyroidism, hyperlipidemia, anxiety and depression and vitamin D deficiency.  Cassandra Stephens currently takes levothyroxine 50 mcg daily for hypothyroidism.  The alprazolam 0.25 mg Cassandra Stephens takes only when Cassandra Stephens needs it for anxiety and Cassandra Stephens does not take it every day.  Cassandra Stephens has a 73-year-old son who accompanied her to her office visit today but her mother came and picked up her son so Cassandra Stephens could finish her office visit.  Her blood pressure and other vital signs are stable and within normal limits.  Her weight and BMI are normal.  Cassandra Stephens had a Pap last year that was normal.  Cassandra Stephens is not due for any other preventive screenings.  Cassandra Stephens is a non-smoker and denies recreational drug use.  Cassandra Stephens reports that Cassandra Stephens does occasionally drink alcohol but not regularly enough to estimate an amount. ?Cassandra Stephens has recently been treated for it and has recovered from a sinus infection.  Cassandra Stephens reports that Cassandra Stephens gets those intermittently throughout the year.  ?Cassandra Stephens has no other questions or concerns today.  Cassandra Stephens denies any pain. ? ? ? ?Current Medication: ?Outpatient Encounter Medications as of 07/05/2021  ?Medication Sig  ? b complex vitamins capsule Take 1 capsule by mouth daily.  ? Cholecalciferol (VITAMIN D3) 50 MCG (2000 UT) TABS Take by mouth.  ? fluticasone (FLONASE) 50 MCG/ACT nasal spray Place 2 sprays into both nostrils daily.  ? Multiple Vitamins-Minerals (MULTIVITAMIN WOMEN) TABS Take by mouth.  ? Probiotic Product (PROBIOTIC DAILY) CAPS Take by mouth.  ? [DISCONTINUED] ALPRAZolam (XANAX) 0.25 MG tablet Take 1 tablet (0.25 mg total) by mouth at bedtime as needed for  anxiety.  ? [DISCONTINUED] levothyroxine (SYNTHROID) 50 MCG tablet TAKE ONE TAB IN AM ON EMPTY STOMACH  ? ALPRAZolam (XANAX) 0.25 MG tablet Take 1 tablet (0.25 mg total) by mouth at bedtime as needed for anxiety.  ? levothyroxine (SYNTHROID) 50 MCG tablet TAKE ONE TAB IN AM ON EMPTY STOMACH  ? [DISCONTINUED] amoxicillin-clavulanate (AUGMENTIN) 875-125 MG tablet Take 1 tablet by mouth 2 (two) times daily. Take with food. (Patient not taking: Reported on 07/05/2021)  ? [DISCONTINUED] benzonatate (TESSALON PERLES) 100 MG capsule Take 1 capsule (100 mg total) by mouth 3 (three) times daily as needed. (Patient not taking: Reported on 07/05/2021)  ? [DISCONTINUED] predniSONE (STERAPRED UNI-PAK 21 TAB) 10 MG (21) TBPK tablet Use as directed (Patient not taking: Reported on 07/05/2021)  ? [DISCONTINUED] promethazine-dextromethorphan (PROMETHAZINE-DM) 6.25-15 MG/5ML syrup Take 5 mLs by mouth 3 (three) times daily as needed for cough. (Patient not taking: Reported on 07/05/2021)  ? ?No facility-administered encounter medications on file as of 07/05/2021.  ? ? ?Surgical History: ?Past Surgical History:  ?Procedure Laterality Date  ? TONSILECTOMY, ADENOIDECTOMY, BILATERAL MYRINGOTOMY AND TUBES    ? TONSILLECTOMY Bilateral   ? ? ?Medical History: ?Past Medical History:  ?Diagnosis Date  ? Anxiety   ? Depression   ? Herpes simplex virus (HSV) type I or type II DNA not detected by PCR   ? Thyroid disease   ? ? ?Family History: ?Family History  ?Problem Relation Age of Onset  ? Hyperlipidemia Father   ?  Hypertension Father   ? Heart attack Father   ? Diabetes Maternal Grandfather   ? Diabetes Paternal Grandfather   ? Heart disease Paternal Grandfather   ? ? ?Social History  ? ?Socioeconomic History  ? Marital status: Married  ?  Spouse name: Not on file  ? Number of children: Not on file  ? Years of education: Not on file  ? Highest education level: Not on file  ?Occupational History  ? Not on file  ?Tobacco Use  ? Smoking status: Never   ? Smokeless tobacco: Never  ?Vaping Use  ? Vaping Use: Never used  ?Substance and Sexual Activity  ? Alcohol use: Yes  ?  Comment: ocassionally  ? Drug use: Never  ? Sexual activity: Not Currently  ?  Birth control/protection: None  ?Other Topics Concern  ? Not on file  ?Social History Narrative  ? Not on file  ? ?Social Determinants of Health  ? ?Financial Resource Strain: Not on file  ?Food Insecurity: Not on file  ?Transportation Needs: Not on file  ?Physical Activity: Not on file  ?Stress: Not on file  ?Social Connections: Not on file  ?Intimate Partner Violence: Not on file  ? ? ? ? ?Review of Systems  ?Constitutional:  Negative for activity change, appetite change, chills, fatigue, fever and unexpected weight change.  ?HENT: Negative.  Negative for congestion, ear pain, rhinorrhea, sore throat and trouble swallowing.   ?Eyes: Negative.   ?Respiratory: Negative.  Negative for cough, chest tightness, shortness of breath and wheezing.   ?Cardiovascular: Negative.  Negative for chest pain.  ?Gastrointestinal: Negative.  Negative for abdominal pain, blood in stool, constipation, diarrhea, nausea and vomiting.  ?Endocrine: Negative.   ?Genitourinary: Negative.  Negative for difficulty urinating, dysuria, frequency, hematuria and urgency.  ?Musculoskeletal: Negative.  Negative for arthralgias, back pain, joint swelling, myalgias and neck pain.  ?Skin: Negative.  Negative for rash and wound.  ?Allergic/Immunologic: Negative.  Negative for immunocompromised state.  ?Neurological: Negative.  Negative for dizziness, seizures, numbness and headaches.  ?Hematological: Negative.   ?Psychiatric/Behavioral: Negative.  Negative for behavioral problems, self-injury and suicidal ideas. The patient is not nervous/anxious.   ? ?Vital Signs: ?BP 102/60   Pulse 76   Temp 98.7 ?F (37.1 ?C)   Resp 16   Ht  (1.626 m)   Wt 141 lb 12.8 oz (64.3 kg)   SpO2 97%   BMI 24.34 kg/m?  ? ? ?Physical Exam ?Vitals reviewed.   ?Constitutional:   ?   General: Cassandra Stephens is awake. Cassandra Stephens is not in acute distress. ?   Appearance: Normal appearance. Cassandra Stephens is well-developed, well-groomed and normal weight. Cassandra Stephens is not ill-appearing or diaphoretic.  ?HENT:  ?   Head: Normocephalic and atraumatic.  ?   Right Ear: Tympanic membrane, ear canal and external ear normal.  ?   Left Ear: Tympanic membrane, ear canal and external ear normal.  ?   Nose: No congestion or rhinorrhea.  ?   Mouth/Throat:  ?   Lips: Pink.  ?   Mouth: Mucous membranes are moist.  ?   Pharynx: Oropharynx is clear. Uvula midline. No oropharyngeal exudate or posterior oropharyngeal erythema.  ?Eyes:  ?   General: Lids are normal. Vision grossly intact. Gaze aligned appropriately. No scleral icterus.    ?   Right eye: No discharge.     ?   Left eye: No discharge.  ?   Extraocular Movements: Extraocular movements intact.  ?   Conjunctiva/sclera: Conjunctivae normal.  ?  Pupils: Pupils are equal, round, and reactive to light.  ?   Funduscopic exam: ?   Right eye: Red reflex present.     ?   Left eye: Red reflex present. ?Neck:  ?   Thyroid: No thyromegaly.  ?   Vascular: No JVD.  ?   Trachea: Trachea and phonation normal. No tracheal deviation.  ?Cardiovascular:  ?   Rate and Rhythm: Normal rate and regular rhythm.  ?   Pulses: Normal pulses.  ?   Heart sounds: Normal heart sounds, S1 normal and S2 normal. No murmur heard. ?  No friction rub. No gallop.  ?Pulmonary:  ?   Effort: Pulmonary effort is normal. No accessory muscle usage or respiratory distress.  ?   Breath sounds: Normal breath sounds and air entry. No stridor. No wheezing or rales.  ?Chest:  ?   Chest wall: No tenderness.  ?   Comments: Declined breast exam, sees OB/GYN ?Abdominal:  ?   General: Bowel sounds are normal. There is no distension.  ?   Palpations: Abdomen is soft. There is no shifting dullness, fluid wave, mass or pulsatile mass.  ?   Tenderness: There is no abdominal tenderness. There is no guarding or rebound.   ?Musculoskeletal:     ?   General: No tenderness or deformity. Normal range of motion.  ?   Cervical back: Normal range of motion and neck supple.  ?Lymphadenopathy:  ?   Cervical: No cervical adenopathy.  ?Skin: ?   General:

## 2021-07-06 LAB — UA/M W/RFLX CULTURE, ROUTINE
Bilirubin, UA: NEGATIVE
Glucose, UA: NEGATIVE
Ketones, UA: NEGATIVE
Leukocytes,UA: NEGATIVE
Nitrite, UA: NEGATIVE
Protein,UA: NEGATIVE
RBC, UA: NEGATIVE
Specific Gravity, UA: 1.013 (ref 1.005–1.030)
Urobilinogen, Ur: 0.2 mg/dL (ref 0.2–1.0)
pH, UA: 8 — ABNORMAL HIGH (ref 5.0–7.5)

## 2021-07-06 LAB — MICROSCOPIC EXAMINATION
Casts: NONE SEEN /lpf
Epithelial Cells (non renal): >10 /hpf — AB (ref 0–10)
WBC, UA: NONE SEEN /hpf (ref 0–5)

## 2021-07-08 DIAGNOSIS — E782 Mixed hyperlipidemia: Secondary | ICD-10-CM | POA: Diagnosis not present

## 2021-07-08 DIAGNOSIS — D649 Anemia, unspecified: Secondary | ICD-10-CM | POA: Diagnosis not present

## 2021-07-08 DIAGNOSIS — E559 Vitamin D deficiency, unspecified: Secondary | ICD-10-CM | POA: Diagnosis not present

## 2021-07-08 DIAGNOSIS — E038 Other specified hypothyroidism: Secondary | ICD-10-CM | POA: Diagnosis not present

## 2021-07-08 DIAGNOSIS — E538 Deficiency of other specified B group vitamins: Secondary | ICD-10-CM | POA: Diagnosis not present

## 2021-07-09 ENCOUNTER — Encounter: Payer: Self-pay | Admitting: Nurse Practitioner

## 2021-07-09 LAB — CBC WITH DIFFERENTIAL/PLATELET
Basophils Absolute: 0 10*3/uL (ref 0.0–0.2)
Basos: 0 %
EOS (ABSOLUTE): 0.2 10*3/uL (ref 0.0–0.4)
Eos: 2 %
Hematocrit: 40.6 % (ref 34.0–46.6)
Hemoglobin: 13.7 g/dL (ref 11.1–15.9)
Immature Grans (Abs): 0 10*3/uL (ref 0.0–0.1)
Immature Granulocytes: 0 %
Lymphocytes Absolute: 2.2 10*3/uL (ref 0.7–3.1)
Lymphs: 31 %
MCH: 30.8 pg (ref 26.6–33.0)
MCHC: 33.7 g/dL (ref 31.5–35.7)
MCV: 91 fL (ref 79–97)
Monocytes Absolute: 0.6 10*3/uL (ref 0.1–0.9)
Monocytes: 9 %
Neutrophils Absolute: 3.9 10*3/uL (ref 1.4–7.0)
Neutrophils: 58 %
Platelets: 270 10*3/uL (ref 150–450)
RBC: 4.45 x10E6/uL (ref 3.77–5.28)
RDW: 12.1 % (ref 11.7–15.4)
WBC: 7 10*3/uL (ref 3.4–10.8)

## 2021-07-09 LAB — B12 AND FOLATE PANEL
Folate: >20 ng/mL (ref >3.0)
Vitamin B-12: 1025 pg/mL (ref 232–1245)

## 2021-07-09 LAB — LIPID PANEL
Chol/HDL Ratio: 2.8 ratio (ref 0.0–4.4)
Cholesterol, Total: 186 mg/dL (ref 100–199)
HDL: 67 mg/dL (ref >39)
LDL Chol Calc (NIH): 97 mg/dL (ref 0–99)
Triglycerides: 125 mg/dL (ref 0–149)
VLDL Cholesterol Cal: 22 mg/dL (ref 5–40)

## 2021-07-09 LAB — CMP14+EGFR
ALT: 25 IU/L (ref 0–32)
AST: 22 IU/L (ref 0–40)
Albumin/Globulin Ratio: 2.1 (ref 1.2–2.2)
Albumin: 4.8 g/dL (ref 3.9–5.0)
Alkaline Phosphatase: 41 IU/L — ABNORMAL LOW (ref 44–121)
BUN/Creatinine Ratio: 22 (ref 9–23)
BUN: 20 mg/dL (ref 6–20)
Bilirubin Total: 0.4 mg/dL (ref 0.0–1.2)
CO2: 22 mmol/L (ref 20–29)
Calcium: 10 mg/dL (ref 8.7–10.2)
Chloride: 104 mmol/L (ref 96–106)
Creatinine, Ser: 0.93 mg/dL (ref 0.57–1.00)
Globulin, Total: 2.3 g/dL (ref 1.5–4.5)
Glucose: 78 mg/dL (ref 70–99)
Potassium: 4.5 mmol/L (ref 3.5–5.2)
Sodium: 139 mmol/L (ref 134–144)
Total Protein: 7.1 g/dL (ref 6.0–8.5)
eGFR: 86 mL/min/{1.73_m2} (ref >59)

## 2021-07-09 LAB — IRON,TIBC AND FERRITIN PANEL
Ferritin: 67 ng/mL (ref 15–150)
Iron Saturation: 43 % (ref 15–55)
Iron: 149 ug/dL (ref 27–159)
Total Iron Binding Capacity: 343 ug/dL (ref 250–450)
UIBC: 194 ug/dL (ref 131–425)

## 2021-07-09 LAB — TSH+FREE T4
Free T4: 1.48 ng/dL (ref 0.82–1.77)
TSH: 3.37 u[IU]/mL (ref 0.450–4.500)

## 2021-07-09 LAB — VITAMIN D 25 HYDROXY (VIT D DEFICIENCY, FRACTURES): Vit D, 25-Hydroxy: 89.4 ng/mL (ref 30.0–100.0)

## 2021-07-10 ENCOUNTER — Telehealth: Payer: Self-pay

## 2021-07-10 NOTE — Progress Notes (Signed)
Please call patient with results: ?--metabolic panel is grossly normal, liver and kidney function are normal.  ?--vitamin D level is normal ?--B12 and folate are normal ?--thyroid levels are normal ?--CBC is normal ?--cholesterol levels are normal ?--iron panel and ferritin are normal

## 2021-07-10 NOTE — Telephone Encounter (Signed)
-----   Message from Sallyanne Kuster, NP sent at 07/10/2021 10:14 AM EDT ----- ?Please call patient with results: ?--metabolic panel is grossly normal, liver and kidney function are normal.  ?--vitamin D level is normal ?--B12 and folate are normal ?--thyroid levels are normal ?--CBC is normal ?--cholesterol levels are normal ?--iron panel and ferritin are normal ?

## 2021-07-15 NOTE — Telephone Encounter (Signed)
Spoke to pt, provided info.  ?

## 2021-11-24 ENCOUNTER — Telehealth: Payer: BC Managed Care – PPO | Admitting: Physician Assistant

## 2021-11-24 DIAGNOSIS — J019 Acute sinusitis, unspecified: Secondary | ICD-10-CM | POA: Diagnosis not present

## 2021-11-24 DIAGNOSIS — B9689 Other specified bacterial agents as the cause of diseases classified elsewhere: Secondary | ICD-10-CM | POA: Diagnosis not present

## 2021-11-24 MED ORDER — AMOXICILLIN-POT CLAVULANATE 875-125 MG PO TABS: 1.0000 | ORAL_TABLET | Freq: Two times a day (BID) | ORAL | 0 refills | Status: DC

## 2021-11-24 NOTE — Progress Notes (Signed)

## 2021-12-20 ENCOUNTER — Ambulatory Visit
Admission: EM | Admit: 2021-12-20 | Discharge: 2021-12-20 | Disposition: A | Discharge status: Home / Self Care | Payer: BC Managed Care – PPO | Attending: Urgent Care | Admitting: Urgent Care

## 2021-12-20 DIAGNOSIS — J329 Chronic sinusitis, unspecified: Secondary | ICD-10-CM

## 2021-12-20 MED ORDER — PREDNISONE 10 MG PO TABS: 20.0000 mg | ORAL_TABLET | Freq: Every day | ORAL | 0 refills | Status: AC

## 2021-12-20 NOTE — Discharge Instructions (Addendum)
Follow up with your primary care provider if your symptoms are not improving.     

## 2021-12-20 NOTE — ED Triage Notes (Signed)
Patient to Urgent Care with complaints of sore throat  Reports sinus infection 3 weeks ago, was feeling better then felt like it wasn't completely resolved   Now throat is hurting and sore again. Coughing- productive/ sometimes green/yellow tinged. Headaches but not as severe.

## 2021-12-20 NOTE — ED Provider Notes (Signed)
[facility]   [id number] 12/20/21 Arrival Time: 1818  ASSESSMENT & PLAN:  1. Chronic sinusitis, unspecified location    Suspect allergic as etiology for her symptoms. Recommended she resume daily cetirizine and flonase. Will prescribe a course of prednisone to resolve sinus inflammation and PND.  See AVS for discharge instructions.  Meds ordered this encounter  Medications   predniSONE (DELTASONE) 10 MG tablet    Sig: Take 2 tablets (20 mg total) by mouth daily for 5 days.    Dispense:  10 tablet    Refill:  0    Order Specific Question:   Supervising Provider    Answer:   Merrilee Jansky [id number]    Cough medication sedation precautions. Discussed typical duration of symptoms. OTC symptom care as needed. Ensure adequate fluid intake and rest. May f/u with PCP or here as needed.  Reviewed expectations re: course of current medical issues. Questions answered. Outlined signs and symptoms indicating need for more acute intervention. Patient verbalized understanding. After Visit Summary given.   SUBJECTIVE: History from: patient.  Cassandra Stephens is a 31 y.o. female who presents with complaint of nasal congestion, post-nasal drainage. Denies cough, but endorses green sputum when she "forces a cough". with sore throat that she describes as 5/10 but does not interfere with swallow. Gradual onset,  since treatment for bacterial sinusitis ; without fatigue and without body aches. SOB: none. Wheezing: none. Fever: absent. Overall normal PO intake without n/v. Known sick contacts or COVID-19 exposure: no. No specific or significant aggravating or alleviating factors reported. OTC treatment:  Received flu shot this year: no.  Social History   Tobacco Use  Smoking Status Never  Smokeless Tobacco Never    ROS: As per HPI.   OBJECTIVE:  Vitals:   12/20/21 1837 12/20/21 1845  BP: 111/71   Pulse: 80   Resp: 18   Temp: 98.6 F (37 C)   SpO2: 98%   Weight:   140 lb (63.5 kg)  Height:  5\' 4"  (1.626 m)     General appearance: alert; appears fatigued HEENT: nasal congestion; clear runny nose; throat irritation secondary to post-nasal drainage Neck: supple without LAD CV: RRR Lungs: unlabored respirations, symmetrical air entry without wheezing; cough: absent Abd: soft Ext: no LE edema Skin: warm and dry Psychological: alert and cooperative; normal mood and affect  Imaging: No results found.  No Known Allergies  Past Medical History:  Diagnosis Date   Anxiety    Depression    Herpes simplex virus (HSV) type I or type II DNA not detected by PCR    Thyroid disease    Family History  Problem Relation Age of Onset   Hyperlipidemia Father    Hypertension Father    Heart attack Father    Diabetes Maternal Grandfather    Diabetes Paternal Grandfather    Heart disease Paternal Grandfather    Social History   Socioeconomic History   Marital status: Married    Spouse name: Not on file   Number of children: Not on file   Years of education: Not on file   Highest education level: Not on file  Occupational History   Not on file  Tobacco Use   Smoking status: Never   Smokeless tobacco: Never  Vaping Use   Vaping Use: Never used  Substance and Sexual Activity   Alcohol use: Yes    Comment: ocassionally   Drug use: Never   Sexual activity: Not Currently    Birth  control/protection: None  Other Topics Concern   Not on file  Social History Narrative   Not on file   Social Determinants of Health   Financial Resource Strain: Low Risk  (03/30/2018)   Overall Financial Resource Strain (CARDIA)    Difficulty of Paying Living Expenses: Not hard at all  Food Insecurity: No Food Insecurity (03/30/2018)   Hunger Vital Sign    Worried About Running Out of Food in the Last Year: Never true    Ran Out of Food in the Last Year: Never true  Transportation Needs: No Transportation Needs (03/30/2018)   PRAPARE - Doctor, general practice (Medical): No    Lack of Transportation (Non-Medical): No  Physical Activity: Inactive (03/30/2018)   Exercise Vital Sign    Days of Exercise per Week: 0 days    Minutes of Exercise per Session: 0 min  Stress: No Stress Concern Present (03/30/2018)   [employer] of Occupational Health - Occupational Stress Questionnaire    Feeling of Stress : Not at all  Social Connections: Somewhat Isolated (03/30/2018)   Social Connection and Isolation Panel [NHANES]    Frequency of Communication with Friends and Family: More than three times a week    Frequency of Social Gatherings with Friends and Family: More than three times a week    Attends Religious Services: Never    Database administrator or Organizations: No    Attends Banker Meetings: Never    Marital Status: Married  Catering manager Violence: Not At Risk (03/30/2018)   Humiliation, Afraid, Rape, and Kick questionnaire    Fear of Current or Ex-Partner: No    Emotionally Abused: No    Physically Abused: No    Sexually Abused: No            Charma Igo, [place] 12/20/21 1916

## 2022-01-20 ENCOUNTER — Ambulatory Visit
Admission: EM | Admit: 2022-01-20 | Discharge: 2022-01-20 | Disposition: A | Discharge status: Home / Self Care | Payer: BC Managed Care – PPO | Attending: Urgent Care | Admitting: Urgent Care

## 2022-01-20 DIAGNOSIS — B9689 Other specified bacterial agents as the cause of diseases classified elsewhere: Secondary | ICD-10-CM | POA: Diagnosis not present

## 2022-01-20 DIAGNOSIS — J019 Acute sinusitis, unspecified: Secondary | ICD-10-CM

## 2022-01-20 DIAGNOSIS — Z1152 Encounter for screening for COVID-19: Secondary | ICD-10-CM | POA: Insufficient documentation

## 2022-01-20 DIAGNOSIS — R6889 Other general symptoms and signs: Secondary | ICD-10-CM

## 2022-01-20 LAB — RESP PANEL BY RT-PCR (RSV, FLU A&B, COVID)  RVPGX2
Influenza A by PCR: NEGATIVE
Influenza B by PCR: NEGATIVE
Resp Syncytial Virus by PCR: NEGATIVE
SARS Coronavirus 2 by RT PCR: NEGATIVE

## 2022-01-20 LAB — POCT RAPID STREP A (OFFICE): Rapid Strep A Screen: NEGATIVE

## 2022-01-20 MED ORDER — AMOXICILLIN-POT CLAVULANATE 875-125 MG PO TABS: 1.0000 | ORAL_TABLET | Freq: Two times a day (BID) | ORAL | 0 refills | Status: DC

## 2022-01-20 MED ORDER — METHYLPREDNISOLONE 4 MG PO TBPK: ORAL_TABLET | ORAL | 0 refills | Status: DC

## 2022-01-20 NOTE — ED Provider Notes (Signed)
Cassandra Stephens    CSN: [id number] Arrival date & time: 01/20/22  [id number]      History   Chief Complaint Chief Complaint  Patient presents with   Sore Throat   Generalized Body Aches   Headache    HPI Cassandra Stephens is a 31 y.o. female.    Sore Throat Associated symptoms include headaches.  Headache   Presents to urgent care with symptoms since last Monday (7 days).  States symptoms started with sinus pressure and headache.  Over the last few days she endorses new symptoms of sore throat, body aches in addition to sinus pressure and headache.  Endorses "fever" of 101.0.  Denies GI pain-no nausea vomiting diarrhea.   Past Medical History:  Diagnosis Date   Anxiety    Depression    Herpes simplex virus (HSV) type I or type II DNA not detected by PCR    Thyroid disease     Patient Active Problem List   Diagnosis Date Noted   Other fatigue 01/22/2020   Subclinical hypothyroidism 01/20/2019   Mixed hyperlipidemia 01/20/2019   Encounter for well adult exam with abnormal findings 01/05/2019   Mastitis, right, acute 01/05/2019   Vitamin D deficiency 05/13/2018    Past Surgical History:  Procedure Laterality Date   TONSILECTOMY, ADENOIDECTOMY, BILATERAL MYRINGOTOMY AND TUBES     TONSILLECTOMY Bilateral     OB History     Gravida  1   Para  1   Term  1   Preterm  0   AB  0   Living  1      SAB  0   IAB  0   Ectopic  0   Multiple  0   Live Births  1            Home Medications    Prior to Admission medications   Medication Sig Start Date End Date Taking? Authorizing Provider  ALPRAZolam (XANAX) 0.25 MG tablet Take 1 tablet (0.25 mg total) by mouth at bedtime as needed for anxiety. 07/05/21   Jonetta Osgood, NP  b complex vitamins capsule Take 1 capsule by mouth daily.    [provider]  Cholecalciferol (VITAMIN D3) 50 MCG (2000 UT) TABS Take by mouth.    [provider]  fluticasone (FLONASE) 50 MCG/ACT nasal  spray Place 2 sprays into both nostrils daily. 06/02/21   Sharion Balloon, FNP  levothyroxine (SYNTHROID) 50 MCG tablet TAKE ONE TAB IN AM ON EMPTY STOMACH 07/05/21   Jonetta Osgood, NP  Multiple Vitamins-Minerals (MULTIVITAMIN WOMEN) TABS Take by mouth.    [provider]  Probiotic Product (PROBIOTIC DAILY) CAPS Take by mouth.    [provider]    Family History Family History  Problem Relation Age of Onset   Hyperlipidemia Father    Hypertension Father    Heart attack Father    Diabetes Maternal Grandfather    Diabetes Paternal Grandfather    Heart disease Paternal Grandfather     Social History Social History   Tobacco Use   Smoking status: Never   Smokeless tobacco: Never  Vaping Use   Vaping Use: Never used  Substance Use Topics   Alcohol use: Yes    Comment: ocassionally   Drug use: Never     Allergies   Patient has no known allergies.   Review of Systems Review of Systems  Neurological:  Positive for headaches.     Physical Exam Triage Vital Signs ED Triage Vitals [  01/20/22 0950]  Enc Vitals Group     BP 97/62     Pulse Rate 98     Resp 16     Temp 98.5 F (36.9 C)     Temp src      SpO2 98 %     Weight      Height      Head Circumference      Peak Flow      Pain Score 7     Pain Loc      Pain Edu?      Excl. in [place]?    No data found.  Updated Vital Signs BP 97/62   Pulse 98   Temp 98.5 F (36.9 C)   Resp 16   LMP 01/13/2022 (Approximate)   SpO2 98%   Visual Acuity Right Eye Distance:   Left Eye Distance:   Bilateral Distance:    Right Eye Near:   Left Eye Near:    Bilateral Near:     Physical Exam Vitals reviewed.  Constitutional:      Appearance: She is well-developed.  HENT:     Mouth/Throat:     Mouth: Mucous membranes are moist.     Pharynx: Posterior oropharyngeal erythema present. No oropharyngeal exudate.     Tonsils: No tonsillar exudate. 2+ on the right. 2+ on the left.  Skin:    General:  Skin is warm.  Neurological:     General: No focal deficit present.     Mental Status: She is alert and oriented to person, place, and time.  Psychiatric:        Mood and Affect: Mood normal.        Behavior: Behavior normal.      UC Treatments / Results  Labs (all labs ordered are listed, but only abnormal results are displayed) Labs Reviewed  RESP PANEL BY RT-PCR (RSV, FLU A&B, COVID)  [id number]  POCT RAPID STREP A (OFFICE)    EKG   Radiology No results found.  Procedures Procedures (including critical care time)  Medications Ordered in UC Medications - No data to display  Initial Impression / Assessment and Plan / UC Course  I have reviewed the triage vital signs and the nursing notes.  Pertinent labs & imaging results that were available during my care of the patient were reviewed by me and considered in my medical decision making (see chart for details).   Rapid strep is negative.  Considering to simultaneous pathologies.  Her rhinosinusitis is now longer than 1 week and possibly bacterial.  Considering her acute symptoms will treat for bacterial sinusitis with Augmentin.  We will also prescribe Medrol taper and recommend she stop taking the medications when her symptoms improve as she says prednisone sometimes affects her mood after taking for several days.  Possible viral URI with her symptoms starting 3 days ago.  Respiratory swab was obtained and is pending.   Final Clinical Impressions(s) / UC Diagnoses   Final diagnoses:  Flu-like symptoms   Discharge Instructions   None    ED Prescriptions   None    PDMP not reviewed this encounter.   Rose Phi, [place] 01/20/22 1008

## 2022-01-20 NOTE — ED Triage Notes (Signed)
Pt. States that since Monday she has been experiencing sinus pressure and a headache. Pt. Also states that over the weekend she started having  a sore throat, body aches along with sinus pressure and a headache.

## 2022-01-20 NOTE — Discharge Instructions (Addendum)
You are prescribed a Medrol taper.  I recommend stop taking this medication if you develop side effects.  You may stop this medication when your symptoms improve to avoid side effects.  Complete the antibiotic.  Follow up here or with your primary care provider if your symptoms are worsening or not improving with treatment.

## 2022-02-20 ENCOUNTER — Telehealth (INDEPENDENT_AMBULATORY_CARE_PROVIDER_SITE_OTHER): Payer: BC Managed Care – PPO | Admitting: Physician Assistant

## 2022-02-20 ENCOUNTER — Encounter: Payer: Self-pay | Admitting: Physician Assistant

## 2022-02-20 DIAGNOSIS — B9689 Other specified bacterial agents as the cause of diseases classified elsewhere: Secondary | ICD-10-CM | POA: Diagnosis not present

## 2022-02-20 DIAGNOSIS — J019 Acute sinusitis, unspecified: Secondary | ICD-10-CM | POA: Diagnosis not present

## 2022-02-20 MED ORDER — AMOXICILLIN-POT CLAVULANATE 875-125 MG PO TABS: 1.0000 | ORAL_TABLET | Freq: Two times a day (BID) | ORAL | 0 refills | Status: DC

## 2022-02-20 MED ORDER — METHYLPREDNISOLONE 4 MG PO TBPK: ORAL_TABLET | ORAL | 0 refills | Status: DC

## 2022-02-20 NOTE — Progress Notes (Signed)
[facility] [address] [place], [place] [address]  Internal MEDICINE  Telephone Visit  Patient Name: Cassandra Stephens  [id number]  [id number]  Date of Service: 02/20/2022  I connected with the patient at 9:30 by telephone and verified the patients identity using two identifiers.   I discussed the limitations, risks, security and privacy concerns of performing an evaluation and management service by telephone and the availability of in person appointments. I also discussed with the patient that there may be a patient responsible charge related to the service.  The patient expressed understanding and agrees to proceed.    Chief Complaint  Patient presents with   Telephone Screen    [phone], prefers either form of call, okay with telephone call   Telephone Assessment   Cough    Some green mucus in the mornings. Beginning to lose her voice. Son did just recover from having croup. Tested negative for Covid.    HPI Pt is here for virtual sick visit -Son was recently sick and diagnosed with ear infection and then eventually with croup and was treated with prednisone. He started getting better but then she started getting sick. She thought it was due to weather changes, but the last week her cough has been much worse and is very productive with green sticky mucus. Starting to lose her voice as well -taking allergy pill, flonase, tried mucinex all in one but may her feel dehydrated without helping symptoms so she stopped that. Discussed trying regularl mucinex or mucinex DM -Covid test negative this morning  Current Medication: Outpatient Encounter Medications as of 02/20/2022  Medication Sig   ALPRAZolam (XANAX) 0.25 MG tablet Take 1 tablet (0.25 mg total) by mouth at bedtime as needed for anxiety.   b complex vitamins capsule Take 1 capsule by mouth daily.   Cholecalciferol (VITAMIN D3) 50 MCG (2000 UT) TABS Take by mouth.   fluticasone (FLONASE) 50 MCG/ACT nasal spray  Place 2 sprays into both nostrils daily.   levothyroxine (SYNTHROID) 50 MCG tablet TAKE ONE TAB IN AM ON EMPTY STOMACH   Multiple Vitamins-Minerals (MULTIVITAMIN WOMEN) TABS Take by mouth.   Probiotic Product (PROBIOTIC DAILY) CAPS Take by mouth.   [DISCONTINUED] amoxicillin-clavulanate (AUGMENTIN) 875-125 MG tablet Take 1 tablet by mouth every 12 (twelve) hours.   [DISCONTINUED] methylPREDNISolone (MEDROL DOSEPAK) 4 MG TBPK tablet Steroid taper. Take as directed by packaging.   amoxicillin-clavulanate (AUGMENTIN) 875-125 MG tablet Take 1 tablet by mouth every 12 (twelve) hours.   methylPREDNISolone (MEDROL DOSEPAK) 4 MG TBPK tablet Steroid taper. Take as directed by packaging.   No facility-administered encounter medications on file as of 02/20/2022.    Surgical History: Past Surgical History:  Procedure Laterality Date   TONSILECTOMY, ADENOIDECTOMY, BILATERAL MYRINGOTOMY AND TUBES     TONSILLECTOMY Bilateral     Medical History: Past Medical History:  Diagnosis Date   Anxiety    Depression    Herpes simplex virus (HSV) type I or type II DNA not detected by PCR    Thyroid disease     Family History: Family History  Problem Relation Age of Onset   Hyperlipidemia Father    Hypertension Father    Heart attack Father    Diabetes Maternal Grandfather    Diabetes Paternal Grandfather    Heart disease Paternal Grandfather     Social History   Socioeconomic History   Marital status: Married    Spouse name: Not on file   Number of children: Not on file  Years of education: Not on file   Highest education level: Not on file  Occupational History   Not on file  Tobacco Use   Smoking status: Never   Smokeless tobacco: Never  Vaping Use   Vaping Use: Never used  Substance and Sexual Activity   Alcohol use: Yes    Comment: ocassionally   Drug use: Never   Sexual activity: Not Currently    Birth control/protection: None  Other Topics Concern   Not on file  Social  History Narrative   Not on file   Social Determinants of Health   Financial Resource Strain: Low Risk  (03/30/2018)   Overall Financial Resource Strain (CARDIA)    Difficulty of Paying Living Expenses: Not hard at all  Food Insecurity: No Food Insecurity (03/30/2018)   Hunger Vital Sign    Worried About Running Out of Food in the Last Year: Never true    Ran Out of Food in the Last Year: Never true  Transportation Needs: No Transportation Needs (03/30/2018)   PRAPARE - Administrator, Civil Service (Medical): No    Lack of Transportation (Non-Medical): No  Physical Activity: Inactive (03/30/2018)   Exercise Vital Sign    Days of Exercise per Week: 0 days    Minutes of Exercise per Session: 0 min  Stress: No Stress Concern Present (03/30/2018)   [employer] of Occupational Health - Occupational Stress Questionnaire    Feeling of Stress : Not at all  Social Connections: Somewhat Isolated (03/30/2018)   Social Connection and Isolation Panel [NHANES]    Frequency of Communication with Friends and Family: More than three times a week    Frequency of Social Gatherings with Friends and Family: More than three times a week    Attends Religious Services: Never    Database administrator or Organizations: No    Attends Banker Meetings: Never    Marital Status: Married  Catering manager Violence: Not At Risk (03/30/2018)   Humiliation, Afraid, Rape, and Kick questionnaire    Fear of Current or Ex-Partner: No    Emotionally Abused: No    Physically Abused: No    Sexually Abused: No      Review of Systems  Constitutional:  Negative for fatigue and fever.  HENT:  Positive for congestion, sinus pressure, sore throat and voice change. Negative for mouth sores and postnasal drip.   Respiratory:  Positive for cough. Negative for shortness of breath and wheezing.   Cardiovascular:  Negative for chest pain.  Genitourinary:  Negative for flank pain.   Psychiatric/Behavioral: Negative.      Vital Signs: Resp 16   Ht 5\' 4"  (1.626 m)   BMI 24.03 kg/m    Observation/Objective:  Pt is able to carry out conversation   Assessment/Plan: 1. Acute bacterial rhinosinusitis Will start augmentin BID with food and may start on steroid taper due to severity of cough. Will stay well hydrated and drink warm tea with honey or use lozenges to help soothe throat. Continue flonase. - amoxicillin-clavulanate (AUGMENTIN) 875-125 MG tablet; Take 1 tablet by mouth every 12 (twelve) hours.  Dispense: 14 tablet; Refill: 0 - methylPREDNISolone (MEDROL DOSEPAK) 4 MG TBPK tablet; Steroid taper. Take as directed by packaging.  Dispense: 21 tablet; Refill: 0   General Counseling: Folashade verbalizes understanding of the findings of today's phone visit and agrees with plan of treatment. I have discussed any further diagnostic evaluation that may be needed or ordered today. We also  reviewed her medications today. she has been encouraged to call the office with any questions or concerns that should arise related to todays visit.    No orders of the defined types were placed in this encounter.   Meds ordered this encounter  Medications   amoxicillin-clavulanate (AUGMENTIN) 875-125 MG tablet    Sig: Take 1 tablet by mouth every 12 (twelve) hours.    Dispense:  14 tablet    Refill:  0   methylPREDNISolone (MEDROL DOSEPAK) 4 MG TBPK tablet    Sig: Steroid taper. Take as directed by packaging.    Dispense:  21 tablet    Refill:  0    Time spent:25 Minutes    Dr Lyndon Code Internal medicine

## 2022-03-21 ENCOUNTER — Other Ambulatory Visit: Payer: Self-pay | Admitting: Internal Medicine

## 2022-03-21 DIAGNOSIS — E039 Hypothyroidism, unspecified: Secondary | ICD-10-CM

## 2022-03-26 ENCOUNTER — Telehealth: Payer: BC Managed Care – PPO | Admitting: Emergency Medicine

## 2022-03-26 DIAGNOSIS — J329 Chronic sinusitis, unspecified: Secondary | ICD-10-CM

## 2022-03-26 MED ORDER — DOXYCYCLINE HYCLATE 100 MG PO CAPS: 100.0000 mg | ORAL_CAPSULE | Freq: Two times a day (BID) | ORAL | 0 refills | Status: DC

## 2022-03-26 MED ORDER — IPRATROPIUM BROMIDE 0.03 % NA SOLN: 2.0000 | Freq: Two times a day (BID) | NASAL | 0 refills | Status: DC

## 2022-03-26 NOTE — Progress Notes (Signed)
E-Visit for Sinus Problems ° °We are sorry that you are not feeling well.  Here is how we plan to help! ° °Based on what you have shared with me it looks like you have sinusitis.  Sinusitis is inflammation and infection in the sinus cavities of the head.  Based on your presentation I believe you most likely have Acute Bacterial Sinusitis.  This is an infection caused by bacteria and is treated with antibiotics. I have prescribed Doxycycline 100mg by mouth twice a day for 10 days. You may use an oral decongestant such as Mucinex D or if you have glaucoma or high blood pressure use plain Mucinex. Saline nasal spray help and can safely be used as often as needed for congestion.  If you develop worsening sinus pain, fever or notice severe headache and vision changes, or if symptoms are not better after completion of antibiotic, please schedule an appointment with a health care provider.   ° °Sinus infections are not as easily transmitted as other respiratory infection, however we still recommend that you avoid close contact with loved ones, especially the very young and elderly.  Remember to wash your hands thoroughly throughout the day as this is the number one way to prevent the spread of infection! ° °Home Care: °Only take medications as instructed by your medical team. °Complete the entire course of an antibiotic. °Do not take these medications with alcohol. °A steam or ultrasonic humidifier can help congestion.  You can place a towel over your head and breathe in the steam from hot water coming from a faucet. °Avoid close contacts especially the very young and the elderly. °Cover your mouth when you cough or sneeze. °Always remember to wash your hands. ° °Get Help Right Away If: °You develop worsening fever or sinus pain. °You develop a severe head ache or visual changes. °Your symptoms persist after you have completed your treatment plan. ° °Make sure you °Understand these instructions. °Will watch your  condition. °Will get help right away if you are not doing well or get worse. ° °Thank you for choosing an e-visit. ° °Your e-visit answers were reviewed by a board certified advanced clinical practitioner to complete your personal care plan. Depending upon the condition, your plan could have included both over the counter or prescription medications. ° °Please review your pharmacy choice. Make sure the pharmacy is open so you can pick up prescription now. If there is a problem, you may contact your provider through MyChart messaging and have the prescription routed to another pharmacy.  Your safety is important to us. If you have drug allergies check your prescription carefully.  ° °For the next 24 hours you can use MyChart to ask questions about today's visit, request a non-urgent call back, or ask for a work or school excuse. °You will get an email in the next two days asking about your experience. I hope that your e-visit has been valuable and will speed your recovery. ° °Approximately 5 minutes was used in reviewing the patient's chart, questionnaire, prescribing medications, and documentation. ° °

## 2022-03-26 NOTE — Addendum Note (Signed)
Addended by: Roxy Horseman B on: 03/26/2022 09:07 AM   Modules accepted: Orders

## 2022-04-15 ENCOUNTER — Telehealth: Payer: Self-pay | Admitting: Nurse Practitioner

## 2022-04-15 NOTE — Telephone Encounter (Signed)
Lvm to move 07/08/22 appointment-Toni 

## 2022-04-24 ENCOUNTER — Encounter: Payer: Self-pay | Admitting: Nurse Practitioner

## 2022-04-24 ENCOUNTER — Telehealth (INDEPENDENT_AMBULATORY_CARE_PROVIDER_SITE_OTHER): Payer: BC Managed Care – PPO | Admitting: Nurse Practitioner

## 2022-04-24 VITALS — Ht 64.0 in | Wt 137.0 lb

## 2022-04-24 DIAGNOSIS — J208 Acute bronchitis due to other specified organisms: Secondary | ICD-10-CM | POA: Diagnosis not present

## 2022-04-24 DIAGNOSIS — B9689 Other specified bacterial agents as the cause of diseases classified elsewhere: Secondary | ICD-10-CM | POA: Diagnosis not present

## 2022-04-24 MED ORDER — PREDNISONE 10 MG (21) PO TBPK: ORAL_TABLET | ORAL | 0 refills | Status: DC

## 2022-04-24 MED ORDER — AMOXICILLIN-POT CLAVULANATE 875-125 MG PO TABS: 1.0000 | ORAL_TABLET | Freq: Two times a day (BID) | ORAL | 0 refills | Status: AC

## 2022-04-24 NOTE — Progress Notes (Signed)
[facility] [place], [place] [address]  Internal MEDICINE  Telephone Visit  Patient Name: Cassandra Stephens  [id number]  [id number]  Date of Service: 04/24/2022  I connected with the patient at 1605 by telephone and verified the patients identity using two identifiers.   I discussed the limitations, risks, security and privacy concerns of performing an evaluation and management service by telephone and the availability of in person appointments. I also discussed with the patient that there may be a patient responsible charge related to the service.  The patient expressed understanding and agrees to proceed.    Chief Complaint  Patient presents with   Telephone Assessment    [id number]   Telephone Screen   Sinusitis   Cough    Greenish and blood  going cough for 3 week pt had flu 2 and 1/2 weeks     HPI Cassandra Stephens presents for a telehealth virtual visit for cough and congestion.  --reports greenish sticky sputum.  --had flu 2.5 weeks ago.  --blood in nasal drainage   Current Medication: Outpatient Encounter Medications as of 04/24/2022  Medication Sig   ALPRAZolam (XANAX) 0.25 MG tablet Take 1 tablet (0.25 mg total) by mouth at bedtime as needed for anxiety.   amoxicillin-clavulanate (AUGMENTIN) 875-125 MG tablet Take 1 tablet by mouth 2 (two) times daily for 10 days.   b complex vitamins capsule Take 1 capsule by mouth daily.   Cholecalciferol (VITAMIN D3) 50 MCG (2000 UT) TABS Take by mouth.   doxycycline (VIBRAMYCIN) 100 MG capsule Take 1 capsule (100 mg total) by mouth 2 (two) times daily.   fluticasone (FLONASE) 50 MCG/ACT nasal spray Place 2 sprays into both nostrils daily.   ipratropium (ATROVENT) 0.03 % nasal spray Place 2 sprays into both nostrils every 12 (twelve) hours.   levothyroxine (SYNTHROID) 50 MCG tablet TAKE 1 TABLET BY MOUTH EVERY MORNING ON EMPTY STOMACH   methylPREDNISolone (MEDROL DOSEPAK) 4 MG TBPK tablet Steroid taper. Take as  directed by packaging.   Multiple Vitamins-Minerals (MULTIVITAMIN WOMEN) TABS Take by mouth.   predniSONE (STERAPRED UNI-PAK 21 TAB) 10 MG (21) TBPK tablet Use as directed for 6 days   Probiotic Product (PROBIOTIC DAILY) CAPS Take by mouth.   No facility-administered encounter medications on file as of 04/24/2022.    Surgical History: Past Surgical History:  Procedure Laterality Date   TONSILECTOMY, ADENOIDECTOMY, BILATERAL MYRINGOTOMY AND TUBES     TONSILLECTOMY Bilateral     Medical History: Past Medical History:  Diagnosis Date   Anxiety    Depression    Herpes simplex virus (HSV) type I or type II DNA not detected by PCR    Thyroid disease     Family History: Family History  Problem Relation Age of Onset   Hyperlipidemia Father    Hypertension Father    Heart attack Father    Diabetes Maternal Grandfather    Diabetes Paternal Grandfather    Heart disease Paternal Grandfather     Social History   Socioeconomic History   Marital status: Married    Spouse name: Not on file   Number of children: Not on file   Years of education: Not on file   Highest education level: Not on file  Occupational History   Not on file  Tobacco Use   Smoking status: Never   Smokeless tobacco: Never  Vaping Use   Vaping Use: Never used  Substance and Sexual Activity   Alcohol use: Yes    Comment:  ocassionally   Drug use: Never   Sexual activity: Not Currently    Birth control/protection: None  Other Topics Concern   Not on file  Social History Narrative   Not on file   Social Determinants of Health   Financial Resource Strain: Low Risk  (03/30/2018)   Overall Financial Resource Strain (CARDIA)    Difficulty of Paying Living Expenses: Not hard at all  Food Insecurity: No Food Insecurity (03/30/2018)   Hunger Vital Sign    Worried About Running Out of Food in the Last Year: Never true    Ran Out of Food in the Last Year: Never true  Transportation Needs: No Transportation  Needs (03/30/2018)   PRAPARE - Hydrologist (Medical): No    Lack of Transportation (Non-Medical): No  Physical Activity: Inactive (03/30/2018)   Exercise Vital Sign    Days of Exercise per Week: 0 days    Minutes of Exercise per Session: 0 min  Stress: No Stress Concern Present (03/30/2018)   [place]    Feeling of Stress : Not at all  Social Connections: Somewhat Isolated (03/30/2018)   Social Connection and Isolation Panel [NHANES]    Frequency of Communication with Friends and Family: More than three times a week    Frequency of Social Gatherings with Friends and Family: More than three times a week    Attends Religious Services: Never    Marine scientist or Organizations: No    Attends Archivist Meetings: Never    Marital Status: Married  Human resources officer Violence: Not At Risk (03/30/2018)   Humiliation, Afraid, Rape, and Kick questionnaire    Fear of Current or Ex-Partner: No    Emotionally Abused: No    Physically Abused: No    Sexually Abused: No      Review of Systems  Constitutional:  Negative for chills, fatigue and fever.  HENT:  Positive for congestion, postnasal drip and sore throat. Negative for sinus pressure.   Respiratory:  Positive for cough and chest tightness. Negative for shortness of breath and wheezing.   Cardiovascular: Negative.  Negative for chest pain and palpitations.  Neurological:  Negative for headaches.    Vital Signs: Ht 5\' 4"  (1.626 m)   Wt 137 lb (62.1 kg)   BMI 23.52 kg/m    Observation/Objective: She is alert and oriented and engages in conversation appropriately. No acute distress noted.     Assessment/Plan: 1. Acute bacterial bronchitis Empiric antibiotic treatment and prednisone taper prescribed.  - amoxicillin-clavulanate (AUGMENTIN) 875-125 MG tablet; Take 1 tablet by mouth 2 (two) times daily for 10 days.   Dispense: 20 tablet; Refill: 0 - predniSONE (STERAPRED UNI-PAK 21 TAB) 10 MG (21) TBPK tablet; Use as directed for 6 days  Dispense: 21 tablet; Refill: 0   General Counseling: Cassandra Stephens verbalizes understanding of the findings of today's phone visit and agrees with plan of treatment. I have discussed any further diagnostic evaluation that may be needed or ordered today. We also reviewed her medications today. she has been encouraged to call the office with any questions or concerns that should arise related to todays visit.  Return if symptoms worsen or fail to improve.   No orders of the defined types were placed in this encounter.   Meds ordered this encounter  Medications   amoxicillin-clavulanate (AUGMENTIN) 875-125 MG tablet    Sig: Take 1 tablet by mouth 2 (two) times daily  for 10 days.    Dispense:  20 tablet    Refill:  0   predniSONE (STERAPRED UNI-PAK 21 TAB) 10 MG (21) TBPK tablet    Sig: Use as directed for 6 days    Dispense:  21 tablet    Refill:  0    Time spent:10 Minutes Time spent with patient included reviewing progress notes, labs, imaging studies, and discussing plan for follow up.  [place] Controlled Substance Database was reviewed by me for overdose risk score (ORS) if appropriate.  This patient was seen by Sallyanne Kuster, FNP-C in collaboration with Dr. Beverely Risen as a part of collaborative care agreement.  Lachele Lievanos R. Tedd Sias, MSN, FNP-C Internal medicine

## 2022-05-25 ENCOUNTER — Telehealth: Payer: BC Managed Care – PPO | Admitting: Nurse Practitioner

## 2022-05-25 DIAGNOSIS — B9789 Other viral agents as the cause of diseases classified elsewhere: Secondary | ICD-10-CM | POA: Diagnosis not present

## 2022-05-25 DIAGNOSIS — J329 Chronic sinusitis, unspecified: Secondary | ICD-10-CM

## 2022-05-25 DIAGNOSIS — J208 Acute bronchitis due to other specified organisms: Secondary | ICD-10-CM

## 2022-05-25 DIAGNOSIS — B9689 Other specified bacterial agents as the cause of diseases classified elsewhere: Secondary | ICD-10-CM | POA: Diagnosis not present

## 2022-05-25 MED ORDER — PREDNISONE 20 MG PO TABS: 20.0000 mg | ORAL_TABLET | Freq: Every day | ORAL | 0 refills | Status: DC

## 2022-05-25 MED ORDER — IPRATROPIUM BROMIDE 0.03 % NA SOLN: 2.0000 | Freq: Two times a day (BID) | NASAL | 0 refills | Status: AC

## 2022-05-25 NOTE — Progress Notes (Signed)
I have spent 5 minutes in review of e-visit questionnaire, review and updating patient chart, medical decision making and response to patient.  ° °Ryiah Bellissimo W Andera Cranmer, NP ° °  °

## 2022-05-25 NOTE — Progress Notes (Signed)
E-Visit for Sinus Problems  We are sorry that you are not feeling well.  Here is how we plan to help!  Based on what you have shared with me it looks like you have sinusitis.  Sinusitis is inflammation and infection in the sinus cavities of the head.  Based on your presentation I believe you most likely have Acute Viral Sinusitis.This is an infection most likely caused by a virus. There is not specific treatment for viral sinusitis other than to help you with the symptoms until the infection runs its course.  You may use an oral decongestant such as Mucinex D or if you have glaucoma or high blood pressure use plain Mucinex. Saline nasal spray help and can safely be used as often as needed for congestion, I have prescribed: Ipratropium Bromide nasal spray 0.03% 2 sprays in eah nostril 2-3 times a day and a few days of steroids. At this time it would not be appropriate for an antibiotic.   Some authorities believe that zinc sprays or the use of Echinacea may shorten the course of your symptoms.  Sinus infections are not as easily transmitted as other respiratory infection, however we still recommend that you avoid close contact with loved ones, especially the very young and elderly.  Remember to wash your hands thoroughly throughout the day as this is the number one way to prevent the spread of infection!  Home Care: Only take medications as instructed by your medical team. Do not take these medications with alcohol. A steam or ultrasonic humidifier can help congestion.  You can place a towel over your head and breathe in the steam from hot water coming from a faucet. Avoid close contacts especially the very young and the elderly. Cover your mouth when you cough or sneeze. Always remember to wash your hands.  Get Help Right Away If: You develop worsening fever or sinus pain. You develop a severe head ache or visual changes. Your symptoms persist after you have completed your treatment  plan.  Make sure you Understand these instructions. Will watch your condition. Will get help right away if you are not doing well or get worse.   Thank you for choosing an e-visit.  Your e-visit answers were reviewed by a board certified advanced clinical practitioner to complete your personal care plan. Depending upon the condition, your plan could have included both over the counter or prescription medications.  Please review your pharmacy choice. Make sure the pharmacy is open so you can pick up prescription now. If there is a problem, you may contact your provider through [employer] and have the prescription routed to another pharmacy.  Your safety is important to [place]. If you have drug allergies check your prescription carefully.   For the next 24 hours you can use MyChart to ask questions about today's visit, request a non-urgent call back, or ask for a work or school excuse. You will get an email in the next two days asking about your experience. I hope that your e-visit has been valuable and will speed your recovery.

## 2022-05-29 ENCOUNTER — Ambulatory Visit
Admission: EM | Admit: 2022-05-29 | Discharge: 2022-05-29 | Disposition: A | Discharge status: Home / Self Care | Payer: BC Managed Care – PPO | Attending: Family Medicine | Admitting: Family Medicine

## 2022-05-29 DIAGNOSIS — B9689 Other specified bacterial agents as the cause of diseases classified elsewhere: Secondary | ICD-10-CM | POA: Diagnosis not present

## 2022-05-29 DIAGNOSIS — J019 Acute sinusitis, unspecified: Secondary | ICD-10-CM

## 2022-05-29 MED ORDER — PREDNISONE 50 MG PO TABS: 50.0000 mg | ORAL_TABLET | Freq: Every day | ORAL | 0 refills | Status: AC

## 2022-05-29 MED ORDER — LEVOFLOXACIN 750 MG PO TABS: 750.0000 mg | ORAL_TABLET | Freq: Every day | ORAL | 0 refills | Status: AC

## 2022-05-29 NOTE — Discharge Instructions (Signed)
Consider follow up with an ear, nose and throat doctor. Stop using decongestants other than Xlear nasal spray and saline nasal spray

## 2022-05-29 NOTE — ED Triage Notes (Signed)
Pt c/o ear fullness bilaterally,nasal congestion & sinus pressure x10 days. Had E visit w/[facility] on 2/11 & was given nasal spray & 5 day dose of prednisone w/o relief.

## 2022-05-29 NOTE — ED Provider Notes (Signed)
[facility]    CSN: [id number] Arrival date & time: 05/29/22  1816      History   Chief Complaint Chief Complaint  Patient presents with   Nasal Congestion   Ear Fullness   Facial Pain    HPI Cassandra Stephens is a 32 y.o. female.   HPI   [name] presents for ongoing sinus pressure and nasal congestion for the past 7 months.  She has been having a cycle of getting better and then getting worse then getting better and then getting worse.  She has been seen by several providers including telemedicine for her symptoms with short-term relief.  She is taking antibiotics and steroids that have helped in the past but her symptoms returned.  She does note that she has been driving around in an old car.   She has ear fullness, nasal congestion and sinus pressure for the last 10 days.  She has been using a nasal spray and nasal rinses.  She took Mucinex DM.  She has not had any fever, nausea, vomiting or diarrhea.  No one else has similar symptoms.  She has known allergies that she take allergy medications for which helped in the past but not since this started.     Past Medical History:  Diagnosis Date   Anxiety    Depression    Herpes simplex virus (HSV) type I or type II DNA not detected by PCR    Thyroid disease     Patient Active Problem List   Diagnosis Date Noted   Other fatigue 01/22/2020   Subclinical hypothyroidism 01/20/2019   Mixed hyperlipidemia 01/20/2019   Encounter for well adult exam with abnormal findings 01/05/2019   Mastitis, right, acute 01/05/2019   Vitamin D deficiency 05/13/2018    Past Surgical History:  Procedure Laterality Date   TONSILECTOMY, ADENOIDECTOMY, BILATERAL MYRINGOTOMY AND TUBES     TONSILLECTOMY Bilateral     OB History     Gravida  1   Para  1   Term  1   Preterm  0   AB  0   Living  1      SAB  0   IAB  0   Ectopic  0   Multiple  0   Live Births  1            Home Medications    Prior to  Admission medications   Medication Sig Start Date End Date Taking? Authorizing Provider  ALPRAZolam (XANAX) 0.25 MG tablet Take 1 tablet (0.25 mg total) by mouth at bedtime as needed for anxiety. 07/05/21  Yes Abernathy, Yetta Flock, NP  b complex vitamins capsule Take 1 capsule by mouth daily.   Yes [provider]  Cholecalciferol (VITAMIN D3) 50 MCG (2000 UT) TABS Take by mouth.   Yes [provider]  fluticasone (FLONASE) 50 MCG/ACT nasal spray Place 2 sprays into both nostrils daily. 06/02/21  Yes Hawks, Christy A, FNP  ipratropium (ATROVENT) 0.03 % nasal spray Place 2 sprays into both nostrils every 12 (twelve) hours. 05/25/22  Yes Gildardo Pounds, NP  levofloxacin (LEVAQUIN) 750 MG tablet Take 1 tablet (750 mg total) by mouth daily for 7 days. 05/29/22 06/05/22 Yes Shonia Skilling, Ronnette Juniper, DO  levothyroxine (SYNTHROID) 50 MCG tablet TAKE 1 TABLET BY MOUTH EVERY MORNING ON EMPTY STOMACH 03/21/22  Yes Abernathy, Yetta Flock, NP  Multiple Vitamins-Minerals (MULTIVITAMIN WOMEN) TABS Take by mouth.   Yes [provider]  predniSONE (DELTASONE) 50 MG tablet Take  1 tablet (50 mg total) by mouth daily for 5 days. 05/29/22 06/03/22 Yes Giulian Goldring, Ronnette Juniper, DO  Probiotic Product (PROBIOTIC DAILY) CAPS Take by mouth.   Yes [provider]    Family History Family History  Problem Relation Age of Onset   Hyperlipidemia Father    Hypertension Father    Heart attack Father    Diabetes Maternal Grandfather    Diabetes Paternal Grandfather    Heart disease Paternal Grandfather     Social History Social History   Tobacco Use   Smoking status: Never   Smokeless tobacco: Never  Vaping Use   Vaping Use: Never used  Substance Use Topics   Alcohol use: Yes    Comment: ocassionally   Drug use: Never     Allergies   Patient has no known allergies.   Review of Systems Review of Systems: negative unless otherwise stated in HPI.      Physical Exam Triage Vital Signs ED Triage  Vitals  Enc Vitals Group     BP 05/29/22 1838 113/77     Pulse Rate 05/29/22 1838 77     Resp 05/29/22 1838 16     Temp 05/29/22 1838 98.4 F (36.9 C)     Temp Source 05/29/22 1838 Oral     SpO2 05/29/22 1838 98 %     Weight 05/29/22 1837 140 lb (63.5 kg)     Height 05/29/22 1837 5' 4"$  (1.626 m)     Head Circumference --      Peak Flow --      Pain Score 05/29/22 1836 0     Pain Loc --      Pain Edu? --      Excl. in [place]? --    No data found.  Updated Vital Signs BP 113/77 (BP Location: Left Arm)   Pulse 77   Temp 98.4 F (36.9 C) (Oral)   Resp 16   Ht 5' 4"$  (1.626 m)   Wt 63.5 kg   SpO2 98%   BMI 24.03 kg/m   Visual Acuity Right Eye Distance:   Left Eye Distance:   Bilateral Distance:    Right Eye Near:   Left Eye Near:    Bilateral Near:     Physical Exam GEN:     alert, non-ill appearing female in no distress    HENT:  mucus membranes moist, oropharyngeal without erythema, lesions or exudate, no tonsillar hypertrophy,  moderate erythematous edematous turbinates, clear nasal discharge, bilateral TM normal EYES:   pupils equal and reactive, no scleral injection or discharge NECK:  normal ROM, no lymphadenopathy RESP:  no increased work of breathing, clear to auscultation bilaterally CVS:   regular rate and rhythm Skin:   warm and dry, no rash on visible skin    UC Treatments / Results  Labs (all labs ordered are listed, but only abnormal results are displayed) Labs Reviewed - No data to display  EKG   Radiology No results found.  Procedures Procedures (including critical care time)  Medications Ordered in UC Medications - No data to display  Initial Impression / Assessment and Plan / UC Course  I have reviewed the triage vital signs and the nursing notes.  Pertinent labs & imaging results that were available during my care of the patient were reviewed by me and considered in my medical decision making (see chart for details).       Pt is a  32 y.o. female with Hashimoto thyroiditis who presents for  recurrent sinus infection.  Grae is afebrile here without recent antipyretics. Satting well on room air. Overall pt is non-ill appearing, well hydrated, without respiratory distress.  On exam, she has evidence of pansinusitis.  Advised her to stop using drug decongestants as I worry about tachyphylaxis.  Recommended saline rinses and Xlear. She washes her sinus rinse apparatus and the dishwasher.  I am concerned she may have a pseudomonal infection which is why it is not completely clearing up.  Discussed treating with Levaquin and risks and benefits of these medication discussed and she is agreeable. Given steroid burst as well.  If this does not improve her symptoms I suggested that she be follow up with ear nose and throat.  Return and ED precautions given and voiced understanding. Discussed MDM, treatment plan and plan for follow-up with patient who agrees with plan.     Final Clinical Impressions(s) / UC Diagnoses   Final diagnoses:  Acute bacterial sinusitis     Discharge Instructions      Consider follow up with an ear, nose and throat doctor. Stop using decongestants other than Xlear nasal spray and saline nasal spray      ED Prescriptions     Medication Sig Dispense Auth. Provider   predniSONE (DELTASONE) 50 MG tablet Take 1 tablet (50 mg total) by mouth daily for 5 days. 5 tablet Shadia Larose, DO   levofloxacin (LEVAQUIN) 750 MG tablet Take 1 tablet (750 mg total) by mouth daily for 7 days. 7 tablet Lyndee Hensen, DO      PDMP not reviewed this encounter.   Lyndee Hensen, DO 05/29/22 2047

## 2022-05-30 ENCOUNTER — Telehealth: Payer: Self-pay

## 2022-05-30 ENCOUNTER — Other Ambulatory Visit: Payer: Self-pay | Admitting: Nurse Practitioner

## 2022-05-30 DIAGNOSIS — D649 Anemia, unspecified: Secondary | ICD-10-CM

## 2022-05-30 DIAGNOSIS — E039 Hypothyroidism, unspecified: Secondary | ICD-10-CM

## 2022-05-30 DIAGNOSIS — E538 Deficiency of other specified B group vitamins: Secondary | ICD-10-CM

## 2022-05-30 DIAGNOSIS — E782 Mixed hyperlipidemia: Secondary | ICD-10-CM

## 2022-05-30 DIAGNOSIS — E559 Vitamin D deficiency, unspecified: Secondary | ICD-10-CM

## 2022-05-30 MED ORDER — OFLOXACIN 0.3 % OT SOLN: 5.0000 [drp] | Freq: Every day | OTIC | 0 refills | Status: DC

## 2022-05-30 NOTE — Progress Notes (Signed)
Taking levofloxacin for recurrent sinusitis, needs something for ear infection.  Has upcoming physical, needs routine labs.

## 2022-05-30 NOTE — Telephone Encounter (Signed)
Pt called that she saw urgent yesterday and gave her Levaquin she was concerned about side effect due to Dr explain her Yetta Flock advised her take med and take probiotic with or yogurt and also if this having side effect call us back also alyssa send ear drops and put lab order for her physiccal

## 2022-06-27 DIAGNOSIS — E039 Hypothyroidism, unspecified: Secondary | ICD-10-CM | POA: Diagnosis not present

## 2022-06-27 DIAGNOSIS — E782 Mixed hyperlipidemia: Secondary | ICD-10-CM | POA: Diagnosis not present

## 2022-06-27 DIAGNOSIS — E538 Deficiency of other specified B group vitamins: Secondary | ICD-10-CM | POA: Diagnosis not present

## 2022-06-27 DIAGNOSIS — E559 Vitamin D deficiency, unspecified: Secondary | ICD-10-CM | POA: Diagnosis not present

## 2022-06-27 DIAGNOSIS — D649 Anemia, unspecified: Secondary | ICD-10-CM | POA: Diagnosis not present

## 2022-06-28 LAB — CBC WITH DIFFERENTIAL/PLATELET
Basophils Absolute: 0 10*3/uL (ref 0.0–0.2)
Basos: 1 %
EOS (ABSOLUTE): 0.4 10*3/uL (ref 0.0–0.4)
Eos: 6 %
Hematocrit: 38.1 % (ref 34.0–46.6)
Hemoglobin: 13 g/dL (ref 11.1–15.9)
Immature Grans (Abs): 0 10*3/uL (ref 0.0–0.1)
Immature Granulocytes: 1 %
Lymphocytes Absolute: 2.7 10*3/uL (ref 0.7–3.1)
Lymphs: 42 %
MCH: 31.5 pg (ref 26.6–33.0)
MCHC: 34.1 g/dL (ref 31.5–35.7)
MCV: 92 fL (ref 79–97)
Monocytes Absolute: 0.7 10*3/uL (ref 0.1–0.9)
Monocytes: 11 %
Neutrophils Absolute: 2.5 10*3/uL (ref 1.4–7.0)
Neutrophils: 39 %
Platelets: 224 10*3/uL (ref 150–450)
RBC: 4.13 x10E6/uL (ref 3.77–5.28)
RDW: 12.4 % (ref 11.7–15.4)
WBC: 6.4 10*3/uL (ref 3.4–10.8)

## 2022-06-28 LAB — LIPID PANEL
Chol/HDL Ratio: 2.4 ratio (ref 0.0–4.4)
Cholesterol, Total: 170 mg/dL (ref 100–199)
HDL: 70 mg/dL (ref >39)
LDL Chol Calc (NIH): 85 mg/dL (ref 0–99)
Triglycerides: 78 mg/dL (ref 0–149)
VLDL Cholesterol Cal: 15 mg/dL (ref 5–40)

## 2022-06-28 LAB — B12 AND FOLATE PANEL
Folate: >20 ng/mL (ref >3.0)
Vitamin B-12: 649 pg/mL (ref 232–1245)

## 2022-06-28 LAB — IRON,TIBC AND FERRITIN PANEL
Ferritin: 44 ng/mL (ref 15–150)
Iron Saturation: 50 % (ref 15–55)
Iron: 178 ug/dL — ABNORMAL HIGH (ref 27–159)
Total Iron Binding Capacity: 356 ug/dL (ref 250–450)
UIBC: 178 ug/dL (ref 131–425)

## 2022-06-28 LAB — CMP14+EGFR
ALT: 21 IU/L (ref 0–32)
AST: 16 IU/L (ref 0–40)
Albumin/Globulin Ratio: 2.4 — ABNORMAL HIGH (ref 1.2–2.2)
Albumin: 4.5 g/dL (ref 4.0–5.0)
Alkaline Phosphatase: 42 IU/L — ABNORMAL LOW (ref 44–121)
BUN/Creatinine Ratio: 28 — ABNORMAL HIGH (ref 9–23)
BUN: 24 mg/dL — ABNORMAL HIGH (ref 6–20)
Bilirubin Total: 0.3 mg/dL (ref 0.0–1.2)
CO2: 23 mmol/L (ref 20–29)
Calcium: 9.5 mg/dL (ref 8.7–10.2)
Chloride: 102 mmol/L (ref 96–106)
Creatinine, Ser: 0.86 mg/dL (ref 0.57–1.00)
Globulin, Total: 1.9 g/dL (ref 1.5–4.5)
Glucose: 84 mg/dL (ref 70–99)
Potassium: 4.6 mmol/L (ref 3.5–5.2)
Sodium: 139 mmol/L (ref 134–144)
Total Protein: 6.4 g/dL (ref 6.0–8.5)
eGFR: 94 mL/min/{1.73_m2} (ref >59)

## 2022-06-28 LAB — VITAMIN D 25 HYDROXY (VIT D DEFICIENCY, FRACTURES): Vit D, 25-Hydroxy: 97.2 ng/mL (ref 30.0–100.0)

## 2022-06-28 LAB — TSH+FREE T4
Free T4: 1.24 ng/dL (ref 0.82–1.77)
TSH: 1.18 u[IU]/mL (ref 0.450–4.500)

## 2022-07-07 NOTE — Progress Notes (Signed)
Will discuss results at upcoming visit.

## 2022-07-08 ENCOUNTER — Encounter: Payer: BC Managed Care – PPO | Admitting: Nurse Practitioner

## 2022-07-09 ENCOUNTER — Telehealth: Payer: Self-pay | Admitting: Nurse Practitioner

## 2022-07-09 NOTE — Telephone Encounter (Signed)
Left vm and sent mychart message to confirm 07/14/22 appointment-Toni

## 2022-07-15 ENCOUNTER — Ambulatory Visit (INDEPENDENT_AMBULATORY_CARE_PROVIDER_SITE_OTHER): Payer: BC Managed Care – PPO | Admitting: Nurse Practitioner

## 2022-07-15 ENCOUNTER — Encounter: Payer: Self-pay | Admitting: Nurse Practitioner

## 2022-07-15 VITALS — BP 122/81 | HR 93 | Temp 98.3°F | Resp 16 | Ht 64.0 in | Wt 145.2 lb

## 2022-07-15 DIAGNOSIS — E039 Hypothyroidism, unspecified: Secondary | ICD-10-CM | POA: Diagnosis not present

## 2022-07-15 DIAGNOSIS — J3089 Other allergic rhinitis: Secondary | ICD-10-CM

## 2022-07-15 DIAGNOSIS — R300 Dysuria: Secondary | ICD-10-CM

## 2022-07-15 DIAGNOSIS — F411 Generalized anxiety disorder: Secondary | ICD-10-CM

## 2022-07-15 DIAGNOSIS — Z0001 Encounter for general adult medical examination with abnormal findings: Secondary | ICD-10-CM | POA: Diagnosis not present

## 2022-07-15 MED ORDER — CETIRIZINE HCL 10 MG PO TABS: 10.0000 mg | ORAL_TABLET | Freq: Every day | ORAL | Status: AC

## 2022-07-15 MED ORDER — ALPRAZOLAM 0.25 MG PO TABS: 0.2500 mg | ORAL_TABLET | Freq: Every evening | ORAL | 1 refills | Status: DC | PRN

## 2022-07-15 MED ORDER — MONTELUKAST SODIUM 10 MG PO TABS: 10.0000 mg | ORAL_TABLET | Freq: Every day | ORAL | 3 refills | Status: DC

## 2022-07-15 MED ORDER — LEVOTHYROXINE SODIUM 50 MCG PO TABS: ORAL_TABLET | ORAL | 1 refills | Status: DC

## 2022-07-15 NOTE — Progress Notes (Signed)
[facility] [place], [place] [address]  Internal MEDICINE  Office Visit Note  Patient Name: Cassandra Stephens  [id number]  [id number]  Date of Service: 07/15/2022  Chief Complaint  Patient presents with   Annual Exam   Depression     [name] presents for an annual well visit and physical exam.  Well-appearing 32 y.o. female with no significant medical problems other than hypothyroidism and low vitamin D Pap smear: normal, done in 2022.  Labs: routine labs done in march last month, grossly normal.  New or worsening pain: none Other concerns: none Previously had mirena and it was imbedded Has tried different diets and modifications. Feels like she just keeps gaining weight.    Current Medication: Outpatient Encounter Medications as of 07/15/2022  Medication Sig   cetirizine (ZYRTEC) 10 MG tablet Take 1 tablet (10 mg total) by mouth daily.   montelukast (SINGULAIR) 10 MG tablet Take 1 tablet (10 mg total) by mouth at bedtime.   ALPRAZolam (XANAX) 0.25 MG tablet Take 1 tablet (0.25 mg total) by mouth at bedtime as needed for anxiety.   b complex vitamins capsule Take 1 capsule by mouth daily.   Cholecalciferol (VITAMIN D3) 50 MCG (2000 UT) TABS Take by mouth.   fluticasone (FLONASE) 50 MCG/ACT nasal spray Place 2 sprays into both nostrils daily.   ipratropium (ATROVENT) 0.03 % nasal spray Place 2 sprays into both nostrils every 12 (twelve) hours.   levothyroxine (SYNTHROID) 50 MCG tablet TAKE 1 TABLET BY MOUTH EVERY MORNING ON EMPTY STOMACH   Multiple Vitamins-Minerals (MULTIVITAMIN WOMEN) TABS Take by mouth.   Probiotic Product (PROBIOTIC DAILY) CAPS Take by mouth.   [DISCONTINUED] ALPRAZolam (XANAX) 0.25 MG tablet Take 1 tablet (0.25 mg total) by mouth at bedtime as needed for anxiety.   [DISCONTINUED] levothyroxine (SYNTHROID) 50 MCG tablet TAKE 1 TABLET BY MOUTH EVERY MORNING ON EMPTY STOMACH   [DISCONTINUED] ofloxacin (FLOXIN) 0.3 % OTIC solution Place 5  drops into both ears daily.   No facility-administered encounter medications on file as of 07/15/2022.    Surgical History: Past Surgical History:  Procedure Laterality Date   TONSILECTOMY, ADENOIDECTOMY, BILATERAL MYRINGOTOMY AND TUBES     TONSILLECTOMY Bilateral     Medical History: Past Medical History:  Diagnosis Date   Anxiety    Depression    Herpes simplex virus (HSV) type I or type II DNA not detected by PCR    Thyroid disease     Family History: Family History  Problem Relation Age of Onset   Hyperlipidemia Father    Hypertension Father    Heart attack Father    Diabetes Maternal Grandfather    Diabetes Paternal Grandfather    Heart disease Paternal Grandfather     Social History   Socioeconomic History   Marital status: Married    Spouse name: Not on file   Number of children: Not on file   Years of education: Not on file   Highest education level: Not on file  Occupational History   Not on file  Tobacco Use   Smoking status: Never   Smokeless tobacco: Never  Vaping Use   Vaping Use: Never used  Substance and Sexual Activity   Alcohol use: Yes    Comment: ocassionally   Drug use: Never   Sexual activity: Not Currently    Birth control/protection: None  Other Topics Concern   Not on file  Social History Narrative   Not on file   Social Determinants  of Health   Financial Resource Strain: Low Risk  (03/30/2018)   Overall Financial Resource Strain (CARDIA)    Difficulty of Paying Living Expenses: Not hard at all  Food Insecurity: No Food Insecurity (03/30/2018)   Hunger Vital Sign    Worried About Running Out of Food in the Last Year: Never true    Ran Out of Food in the Last Year: Never true  Transportation Needs: No Transportation Needs (03/30/2018)   PRAPARE - Hydrologist (Medical): No    Lack of Transportation (Non-Medical): No  Physical Activity: Inactive (03/30/2018)   Exercise Vital Sign    Days of  Exercise per Week: 0 days    Minutes of Exercise per Session: 0 min  Stress: No Stress Concern Present (03/30/2018)   [place]    Feeling of Stress : Not at all  Social Connections: Somewhat Isolated (03/30/2018)   Social Connection and Isolation Panel [NHANES]    Frequency of Communication with Friends and Family: More than three times a week    Frequency of Social Gatherings with Friends and Family: More than three times a week    Attends Religious Services: Never    Marine scientist or Organizations: No    Attends Archivist Meetings: Never    Marital Status: Married  Human resources officer Violence: Not At Risk (03/30/2018)   Humiliation, Afraid, Rape, and Kick questionnaire    Fear of Current or Ex-Partner: No    Emotionally Abused: No    Physically Abused: No    Sexually Abused: No      Review of Systems  Constitutional:  Negative for activity change, appetite change, chills, fatigue, fever and unexpected weight change.  HENT: Negative.  Negative for congestion, ear pain, rhinorrhea, sore throat and trouble swallowing.   Eyes: Negative.   Respiratory: Negative.  Negative for cough, chest tightness, shortness of breath and wheezing.   Cardiovascular: Negative.  Negative for chest pain.  Gastrointestinal: Negative.  Negative for abdominal pain, blood in stool, constipation, diarrhea, nausea and vomiting.  Endocrine: Negative.   Genitourinary: Negative.  Negative for difficulty urinating, dysuria, frequency, hematuria and urgency.  Musculoskeletal: Negative.  Negative for arthralgias, back pain, joint swelling, myalgias and neck pain.  Skin: Negative.  Negative for rash and wound.  Allergic/Immunologic: Negative.  Negative for immunocompromised state.  Neurological: Negative.  Negative for dizziness, seizures, numbness and headaches.  Hematological: Negative.   Psychiatric/Behavioral:  Positive for  depression. Negative for behavioral problems, self-injury and suicidal ideas. The patient is not nervous/anxious.     Vital Signs: BP 122/81   Pulse 93   Temp 98.3 F (36.8 C)   Resp 16   Ht 5\' 4"  (1.626 m)   Wt 145 lb 3.2 oz (65.9 kg)   SpO2 99%   BMI 24.92 kg/m    Physical Exam Vitals reviewed.  Constitutional:      General: She is awake. She is not in acute distress.    Appearance: Normal appearance. She is well-developed, well-groomed and normal weight. She is not ill-appearing or diaphoretic.  HENT:     Head: Normocephalic and atraumatic.     Right Ear: Tympanic membrane, ear canal and external ear normal.     Left Ear: Tympanic membrane, ear canal and external ear normal.     Nose: No congestion or rhinorrhea.     Mouth/Throat:     Lips: Pink.     Mouth:  Mucous membranes are moist.     Pharynx: Oropharynx is clear. Uvula midline. No oropharyngeal exudate or posterior oropharyngeal erythema.  Eyes:     General: Lids are normal. Vision grossly intact. Gaze aligned appropriately. No scleral icterus.       Right eye: No discharge.        Left eye: No discharge.     Extraocular Movements: Extraocular movements intact.     Conjunctiva/sclera: Conjunctivae normal.     Pupils: Pupils are equal, round, and reactive to light.     Funduscopic exam:    Right eye: Red reflex present.        Left eye: Red reflex present. Neck:     Thyroid: No thyromegaly.     Vascular: No JVD.     Trachea: Trachea and phonation normal. No tracheal deviation.  Cardiovascular:     Rate and Rhythm: Normal rate and regular rhythm.     Pulses: Normal pulses.     Heart sounds: Normal heart sounds, S1 normal and S2 normal. No murmur heard.    No friction rub. No gallop.  Pulmonary:     Effort: Pulmonary effort is normal. No accessory muscle usage or respiratory distress.     Breath sounds: Normal breath sounds and air entry. No stridor. No wheezing or rales.  Chest:     Chest wall: No  tenderness.     Comments: Declined breast exam, sees OB/GYN Abdominal:     General: Bowel sounds are normal. There is no distension.     Palpations: Abdomen is soft. There is no shifting dullness, fluid wave, mass or pulsatile mass.     Tenderness: There is no abdominal tenderness. There is no guarding or rebound.  Musculoskeletal:        General: No tenderness or deformity. Normal range of motion.     Cervical back: Normal range of motion and neck supple.  Lymphadenopathy:     Cervical: No cervical adenopathy.  Skin:    General: Skin is warm and dry.     Capillary Refill: Capillary refill takes less than 2 seconds.     Coloration: Skin is not pale.     Findings: No erythema or rash.  Neurological:     Mental Status: She is alert and oriented to person, place, and time.     Cranial Nerves: No cranial nerve deficit.     Motor: No abnormal muscle tone.     Coordination: Coordination normal.     Gait: Gait normal.     Deep Tendon Reflexes: Reflexes are normal and symmetric.  Psychiatric:        Mood and Affect: Mood and affect normal.        Behavior: Behavior normal. Behavior is cooperative.        Thought Content: Thought content normal.        Judgment: Judgment normal.        Assessment/Plan: 1. Encounter for routine adult health examination with abnormal findings Age-appropriate preventive screenings and vaccinations discussed, annual physical exam completed. Routine labs for health maintenance results discussed with the patient today. PHM updated.   2. Acquired hypothyroidism Continue levothyroxine as prescribed - levothyroxine (SYNTHROID) 50 MCG tablet; TAKE 1 TABLET BY MOUTH EVERY MORNING ON EMPTY STOMACH  Dispense: 90 tablet; Refill: 1  3. Non-seasonal allergic rhinitis due to other allergic trigger Continue cetirizine as prescribed. Start montelukast10 mg at bedtime.  - cetirizine (ZYRTEC) 10 MG tablet; Take 1 tablet (10 mg total) by mouth daily. -  montelukast  (SINGULAIR) 10 MG tablet; Take 1 tablet (10 mg total) by mouth at bedtime.  Dispense: 30 tablet; Refill: 3  4. GAD (generalized anxiety disorder) Refill of alprazolam, takes it prn for anxiety, does not need it often, take as prescribed.  - ALPRAZolam (XANAX) 0.25 MG tablet; Take 1 tablet (0.25 mg total) by mouth at bedtime as needed for anxiety.  Dispense: 30 tablet; Refill: 1     General Counseling: [place] verbalizes understanding of the findings of todays visit and agrees with plan of treatment. I have discussed any further diagnostic evaluation that may be needed or ordered today. We also reviewed her medications today. she has been encouraged to call the office with any questions or concerns that should arise related to todays visit.    No orders of the defined types were placed in this encounter.   Meds ordered this encounter  Medications   ALPRAZolam (XANAX) 0.25 MG tablet    Sig: Take 1 tablet (0.25 mg total) by mouth at bedtime as needed for anxiety.    Dispense:  30 tablet    Refill:  1   levothyroxine (SYNTHROID) 50 MCG tablet    Sig: TAKE 1 TABLET BY MOUTH EVERY MORNING ON EMPTY STOMACH    Dispense:  90 tablet    Refill:  1   cetirizine (ZYRTEC) 10 MG tablet    Sig: Take 1 tablet (10 mg total) by mouth daily.   montelukast (SINGULAIR) 10 MG tablet    Sig: Take 1 tablet (10 mg total) by mouth at bedtime.    Dispense:  30 tablet    Refill:  3    New script fill today.    Return in about 1 year (around 07/15/2023) for CPE, [place] PCP and otherwise as needed. .   Total time spent:30 Minutes Time spent includes review of chart, medications, test results, and follow up plan with the patient.   [place] Controlled Substance Database was reviewed by me.  This patient was seen by Jonetta Osgood, FNP-C in collaboration with Dr. Clayborn Bigness as a part of collaborative care agreement.  Jaidynn Balster R. Valetta Fuller, MSN, FNP-C Internal medicine

## 2022-10-12 ENCOUNTER — Other Ambulatory Visit: Payer: Self-pay | Admitting: Nurse Practitioner

## 2022-10-12 DIAGNOSIS — J3089 Other allergic rhinitis: Secondary | ICD-10-CM

## 2022-11-13 ENCOUNTER — Telehealth: Payer: BC Managed Care – PPO | Admitting: Physician Assistant

## 2022-11-13 DIAGNOSIS — H1032 Unspecified acute conjunctivitis, left eye: Secondary | ICD-10-CM

## 2022-11-13 MED ORDER — POLYMYXIN B-TRIMETHOPRIM 10000-0.1 UNIT/ML-% OP SOLN: OPHTHALMIC | 0 refills | Status: DC

## 2022-11-13 NOTE — Progress Notes (Signed)
I have spent 5 minutes in review of e-visit questionnaire, review and updating patient chart, medical decision making and response to patient.   William Cody Martin, PA-C    

## 2022-11-13 NOTE — Progress Notes (Signed)
E-Visit for Pink Eye ? ? ?We are sorry that you are not feeling well.  Here is how we plan to help! ? ?Based on what you have shared with me it looks like you have conjunctivitis.  Conjunctivitis is a common inflammatory or infectious condition of the eye that is often referred to as "pink eye".  In most cases it is contagious (viral or bacterial). However, not all conjunctivitis requires antibiotics (ex. Allergic).  We have made appropriate suggestions for you based upon your presentation. ? ?I have prescribed Polytrim Ophthalmic drops 1-2 drops 4 times a day times 5 days ? ?Pink eye can be highly contagious.  It is typically spread through direct contact with secretions, or contaminated objects or surfaces that one may have touched.  Strict handwashing is suggested with soap and water is urged.  If not available, use alcohol based had sanitizer.  Avoid unnecessary touching of the eye.  If you wear contact lenses, you will need to refrain from wearing them until you see no white discharge from the eye for at least 24 hours after being on medication.  You should see symptom improvement in 1-2 days after starting the medication regimen.  Call us if symptoms are not improved in 1-2 days. ? ?Home Care: ?Wash your hands often! ?Do not wear your contacts until you complete your treatment plan. ?Avoid sharing towels, bed linen, personal items with a person who has pink eye. ?See attention for anyone in your home with similar symptoms. ? ?Get Help Right Away If: ?Your symptoms do not improve. ?You develop blurred or loss of vision. ?Your symptoms worsen (increased discharge, pain or redness) ? ? ?Thank you for choosing an e-visit. ? ?Your e-visit answers were reviewed by a board certified advanced clinical practitioner to complete your personal care plan. Depending upon the condition, your plan could have included both over the counter or prescription medications. ? ?Please review your pharmacy choice. Make sure the  pharmacy is open so you can pick up prescription now. If there is a problem, you may contact your provider through MyChart messaging and have the prescription routed to another pharmacy.  Your safety is important to us. If you have drug allergies check your prescription carefully.  ? ?For the next 24 hours you can use MyChart to ask questions about today's visit, request a non-urgent call back, or ask for a work or school excuse. ?You will get an email in the next two days asking about your experience. I hope that your e-visit has been valuable and will speed your recovery. ? ?

## 2022-11-16 ENCOUNTER — Telehealth: Payer: BC Managed Care – PPO | Admitting: Nurse Practitioner

## 2022-11-16 DIAGNOSIS — J020 Streptococcal pharyngitis: Secondary | ICD-10-CM

## 2022-11-16 MED ORDER — AMOXICILLIN 500 MG PO CAPS: 500.0000 mg | ORAL_CAPSULE | Freq: Two times a day (BID) | ORAL | 0 refills | Status: AC

## 2022-11-16 NOTE — Addendum Note (Signed)
Addended by: Bertram Denver on: 11/16/2022 02:08 PM   Modules accepted: Level of Service

## 2022-11-16 NOTE — Progress Notes (Signed)
I have spent 5 minutes in review of e-visit questionnaire, review and updating patient chart, medical decision making and response to patient.  ° °Star Resler W Andreus Cure, NP ° °  °

## 2022-11-16 NOTE — Progress Notes (Signed)

## 2022-12-21 ENCOUNTER — Other Ambulatory Visit: Payer: Self-pay | Admitting: Nurse Practitioner

## 2022-12-21 DIAGNOSIS — E039 Hypothyroidism, unspecified: Secondary | ICD-10-CM

## 2023-02-02 DIAGNOSIS — G43909 Migraine, unspecified, not intractable, without status migrainosus: Secondary | ICD-10-CM | POA: Diagnosis not present

## 2023-03-31 ENCOUNTER — Telehealth: Payer: BC Managed Care – PPO | Admitting: Physician Assistant

## 2023-03-31 DIAGNOSIS — B9689 Other specified bacterial agents as the cause of diseases classified elsewhere: Secondary | ICD-10-CM

## 2023-03-31 DIAGNOSIS — J019 Acute sinusitis, unspecified: Secondary | ICD-10-CM | POA: Diagnosis not present

## 2023-03-31 MED ORDER — AMOXICILLIN-POT CLAVULANATE 875-125 MG PO TABS: 1.0000 | ORAL_TABLET | Freq: Two times a day (BID) | ORAL | 0 refills | Status: DC

## 2023-03-31 NOTE — Progress Notes (Signed)

## 2023-03-31 NOTE — Progress Notes (Signed)
I have spent 5 minutes in review of e-visit questionnaire, review and updating patient chart, medical decision making and response to patient.   Mia Milan Cody Jacklynn Dehaas, PA-C    

## 2023-05-26 DIAGNOSIS — J209 Acute bronchitis, unspecified: Secondary | ICD-10-CM | POA: Diagnosis not present

## 2023-05-26 DIAGNOSIS — R070 Pain in throat: Secondary | ICD-10-CM | POA: Diagnosis not present

## 2023-05-26 DIAGNOSIS — R519 Headache, unspecified: Secondary | ICD-10-CM | POA: Diagnosis not present

## 2023-05-26 DIAGNOSIS — Z20822 Contact with and (suspected) exposure to covid-19: Secondary | ICD-10-CM | POA: Diagnosis not present

## 2023-05-26 DIAGNOSIS — R0602 Shortness of breath: Secondary | ICD-10-CM | POA: Diagnosis not present

## 2023-06-12 ENCOUNTER — Other Ambulatory Visit: Payer: Self-pay | Admitting: Nurse Practitioner

## 2023-06-12 DIAGNOSIS — J3089 Other allergic rhinitis: Secondary | ICD-10-CM

## 2023-07-13 ENCOUNTER — Telehealth: Payer: Self-pay | Admitting: Nurse Practitioner

## 2023-07-13 NOTE — Telephone Encounter (Signed)
 Left vm and sent mychart message to confirm 07/20/23 appointment-Toni

## 2023-07-16 ENCOUNTER — Encounter: Payer: BC Managed Care – PPO | Admitting: Nurse Practitioner

## 2023-07-20 ENCOUNTER — Encounter: Payer: BC Managed Care – PPO | Admitting: Nurse Practitioner

## 2023-07-27 ENCOUNTER — Encounter: Payer: Self-pay | Admitting: Nurse Practitioner

## 2023-07-27 ENCOUNTER — Ambulatory Visit (INDEPENDENT_AMBULATORY_CARE_PROVIDER_SITE_OTHER): Payer: Self-pay | Admitting: Nurse Practitioner

## 2023-07-27 VITALS — BP 112/70 | HR 90 | Temp 98.6°F | Resp 16 | Ht 64.0 in | Wt 131.0 lb

## 2023-07-27 DIAGNOSIS — E538 Deficiency of other specified B group vitamins: Secondary | ICD-10-CM

## 2023-07-27 DIAGNOSIS — E782 Mixed hyperlipidemia: Secondary | ICD-10-CM

## 2023-07-27 DIAGNOSIS — G43009 Migraine without aura, not intractable, without status migrainosus: Secondary | ICD-10-CM

## 2023-07-27 DIAGNOSIS — E559 Vitamin D deficiency, unspecified: Secondary | ICD-10-CM

## 2023-07-27 DIAGNOSIS — I951 Orthostatic hypotension: Secondary | ICD-10-CM | POA: Diagnosis not present

## 2023-07-27 DIAGNOSIS — F411 Generalized anxiety disorder: Secondary | ICD-10-CM

## 2023-07-27 DIAGNOSIS — D649 Anemia, unspecified: Secondary | ICD-10-CM | POA: Diagnosis not present

## 2023-07-27 DIAGNOSIS — E039 Hypothyroidism, unspecified: Secondary | ICD-10-CM | POA: Diagnosis not present

## 2023-07-27 DIAGNOSIS — Z0001 Encounter for general adult medical examination with abnormal findings: Secondary | ICD-10-CM

## 2023-07-27 MED ORDER — RIZATRIPTAN BENZOATE 10 MG PO TBDP: 10.0000 mg | ORAL_TABLET | Freq: Every day | ORAL | 0 refills | Status: AC | PRN

## 2023-07-27 MED ORDER — ALPRAZOLAM 0.25 MG PO TABS: 0.2500 mg | ORAL_TABLET | Freq: Every evening | ORAL | 1 refills | Status: AC | PRN

## 2023-07-27 NOTE — Progress Notes (Signed)
 [facility] [address] [place], [place] [address]  Internal MEDICINE  Office Visit Note  Patient Name: Cassandra Stephens  [id number]  [id number]  Date of Service: 07/27/2023  Chief Complaint  Patient presents with   Depression   Annual Exam    HPI Denise presents for an annual well visit and physical exam.  Well-appearing 33 y.o. female with hypothyroidism and vitamin D  deficiency. Pap smear: pap is due in 2 years  Labs: due for routine labs  New or worsening pain: none  Has frequent episodes of dizziness, has been getting worse over the past 6-8 months, had the flu in January and had vision changes briefly but this resolved. Dizziness without vertigo Gets migraines a couple times per year   Current Medication: Outpatient Encounter Medications as of 07/27/2023  Medication Sig   amoxicillin -clavulanate (AUGMENTIN ) 875-125 MG tablet Take 1 tablet by mouth 2 (two) times daily.   b complex vitamins capsule Take 1 capsule by mouth daily.   cetirizine  (ZYRTEC ) 10 MG tablet Take 1 tablet (10 mg total) by mouth daily.   Cholecalciferol (VITAMIN D3) 50 MCG (2000 UT) TABS Take by mouth.   fluticasone  (FLONASE ) 50 MCG/ACT nasal spray Place 2 sprays into both nostrils daily.   ipratropium (ATROVENT ) 0.03 % nasal spray Place 2 sprays into both nostrils every 12 (twelve) hours.   levothyroxine  (SYNTHROID ) 50 MCG tablet TAKE 1 TABLET BY MOUTH EVERY MORNING ON AN EMPTY STOMACH   montelukast  (SINGULAIR ) 10 MG tablet TAKE 1 TABLET BY MOUTH EVERYDAY AT BEDTIME   Multiple Vitamins-Minerals (MULTIVITAMIN WOMEN) TABS Take by mouth.   Probiotic Product (PROBIOTIC DAILY) CAPS Take by mouth.   rizatriptan  (MAXALT -MLT) 10 MG disintegrating tablet Take 1 tablet (10 mg total) by mouth daily as needed for migraine. May repeat in 2 hours if needed x1 dose   [DISCONTINUED] ALPRAZolam  (XANAX ) 0.25 MG tablet Take 1 tablet (0.25 mg total) by mouth at bedtime as needed for anxiety.   ALPRAZolam   (XANAX ) 0.25 MG tablet Take 1 tablet (0.25 mg total) by mouth at bedtime as needed for anxiety.   No facility-administered encounter medications on file as of 07/27/2023.    Surgical History: Past Surgical History:  Procedure Laterality Date   TONSILECTOMY, ADENOIDECTOMY, BILATERAL MYRINGOTOMY AND TUBES     TONSILLECTOMY Bilateral     Medical History: Past Medical History:  Diagnosis Date   Anxiety    Depression    Herpes simplex virus (HSV) type I or type II DNA not detected by PCR    Thyroid  disease     Family History: Family History  Problem Relation Age of Onset   Hyperlipidemia Father    Hypertension Father    Heart attack Father    Diabetes Maternal Grandfather    Diabetes Paternal Grandfather    Heart disease Paternal Grandfather     Social History   Socioeconomic History   Marital status: Married    Spouse name: Not on file   Number of children: Not on file   Years of education: Not on file   Highest education level: Not on file  Occupational History   Not on file  Tobacco Use   Smoking status: Never   Smokeless tobacco: Never  Vaping Use   Vaping status: Never Used  Substance and Sexual Activity   Alcohol use: Yes    Comment: ocassionally   Drug use: Never   Sexual activity: Not Currently    Birth control/protection: None  Other Topics Concern  Not on file  Social History Narrative   Not on file   Social Drivers of Health   Financial Resource Strain: Low Risk  (03/30/2018)   Overall Financial Resource Strain (CARDIA)    Difficulty of Paying Living Expenses: Not hard at all  Food Insecurity: No Food Insecurity (03/30/2018)   Hunger Vital Sign    Worried About Running Out of Food in the Last Year: Never true    Ran Out of Food in the Last Year: Never true  Transportation Needs: No Transportation Needs (03/30/2018)   PRAPARE - Administrator, Civil Service (Medical): No    Lack of Transportation (Non-Medical): No  Physical  Activity: Inactive (03/30/2018)   Exercise Vital Sign    Days of Exercise per Week: 0 days    Minutes of Exercise per Session: 0 min  Stress: No Stress Concern Present (03/30/2018)   [employer] of Occupational Health - Occupational Stress Questionnaire    Feeling of Stress : Not at all  Social Connections: Somewhat Isolated (03/30/2018)   Social Connection and Isolation Panel [NHANES]    Frequency of Communication with Friends and Family: More than three times a week    Frequency of Social Gatherings with Friends and Family: More than three times a week    Attends Religious Services: Never    Database administrator or Organizations: No    Attends Banker Meetings: Never    Marital Status: Married  Catering manager Violence: Not At Risk (03/30/2018)   Humiliation, Afraid, Rape, and Kick questionnaire    Fear of Current or Ex-Partner: No    Emotionally Abused: No    Physically Abused: No    Sexually Abused: No      Review of Systems  Constitutional:  Positive for fatigue. Negative for activity change, appetite change, chills, fever and unexpected weight change.  HENT: Negative.  Negative for congestion, ear pain, rhinorrhea, sore throat and trouble swallowing.   Eyes: Negative.   Respiratory: Negative.  Negative for cough, chest tightness, shortness of breath and wheezing.   Cardiovascular: Negative.  Negative for chest pain and palpitations.  Gastrointestinal: Negative.  Negative for abdominal pain, blood in stool, constipation, diarrhea, nausea and vomiting.  Endocrine: Negative.   Genitourinary: Negative.  Negative for difficulty urinating, dysuria, frequency, hematuria and urgency.  Musculoskeletal: Negative.  Negative for arthralgias, back pain, joint swelling, myalgias and neck pain.  Skin: Negative.  Negative for rash and wound.  Allergic/Immunologic: Negative.  Negative for immunocompromised state.  Neurological:  Positive for dizziness, syncope (history  of) and light-headedness. Negative for seizures, numbness and headaches.  Hematological: Negative.   Psychiatric/Behavioral:  Positive for decreased concentration and sleep disturbance. Negative for behavioral problems, self-injury and suicidal ideas. The patient is nervous/anxious.     Vital Signs: BP 112/70   Pulse 90   Temp 98.6 F (37 C)   Resp 16   Ht 5\' 4"  (1.626 m)   Wt 131 lb (59.4 kg)   SpO2 99%   BMI 22.49 kg/m    Physical Exam Vitals reviewed.  Constitutional:      General: She is awake. She is not in acute distress.    Appearance: Normal appearance. She is well-developed, well-groomed and normal weight. She is not ill-appearing or diaphoretic.  HENT:     Head: Normocephalic and atraumatic.     Right Ear: Tympanic membrane, ear canal and external ear normal.     Left Ear: Tympanic membrane, ear canal and external  ear normal.     Nose: Nose normal. No congestion or rhinorrhea.     Mouth/Throat:     Lips: Pink.     Mouth: Mucous membranes are moist.     Pharynx: Oropharynx is clear. Uvula midline. No oropharyngeal exudate or posterior oropharyngeal erythema.  Eyes:     General: Lids are normal. Vision grossly intact. Gaze aligned appropriately. No scleral icterus.       Right eye: No discharge.        Left eye: No discharge.     Extraocular Movements: Extraocular movements intact.     Conjunctiva/sclera: Conjunctivae normal.     Pupils: Pupils are equal, round, and reactive to light.     Funduscopic exam:    Right eye: Red reflex present.        Left eye: Red reflex present. Neck:     Thyroid : No thyromegaly.     Vascular: No JVD.     Trachea: Trachea and phonation normal. No tracheal deviation.  Cardiovascular:     Rate and Rhythm: Normal rate and regular rhythm.     Pulses: Normal pulses.     Heart sounds: Normal heart sounds, S1 normal and S2 normal. No murmur heard.    No friction rub. No gallop.  Pulmonary:     Effort: Pulmonary effort is normal. No  accessory muscle usage or respiratory distress.     Breath sounds: Normal breath sounds and air entry. No stridor. No wheezing or rales.  Chest:     Chest wall: No tenderness.     Comments: Declined breast exam, sees OB/GYN Abdominal:     General: Bowel sounds are normal. There is no distension.     Palpations: Abdomen is soft. There is no shifting dullness, fluid wave, mass or pulsatile mass.     Tenderness: There is no abdominal tenderness. There is no guarding or rebound.  Musculoskeletal:        General: No tenderness or deformity. Normal range of motion.     Cervical back: Normal range of motion and neck supple.  Lymphadenopathy:     Cervical: No cervical adenopathy.  Skin:    General: Skin is warm and dry.     Capillary Refill: Capillary refill takes less than 2 seconds.     Coloration: Skin is not pale.     Findings: No erythema or rash.  Neurological:     Mental Status: She is alert and oriented to person, place, and time.     Cranial Nerves: No cranial nerve deficit.     Motor: No abnormal muscle tone.     Coordination: Coordination normal.     Gait: Gait normal.     Deep Tendon Reflexes: Reflexes are normal and symmetric.  Psychiatric:        Mood and Affect: Mood and affect normal.        Behavior: Behavior normal. Behavior is cooperative.        Thought Content: Thought content normal.        Judgment: Judgment normal.        Assessment/Plan: 1. Encounter for routine adult health examination with abnormal findings (Primary) Age-appropriate preventive screenings and vaccinations discussed, annual physical exam completed. Routine labs for health maintenance ordered, see below. PHM updated.    2. Acquired hypothyroidism Routine labs ordered  - CBC with Differential/Platelet - CMP14+EGFR - Lipid Profile - TSH + free T4  3. Orthostatic hypotension Borderline orthostatic hypotension when checked. Will check labs and follow up in  a few weeks.   4. Mixed  hyperlipidemia Routine labs ordered  - CBC with Differential/Platelet - CMP14+EGFR - Lipid Profile - TSH + free T4  5. Anemia, unspecified type Routine labs ordered - CBC with Differential/Platelet - B12 and Folate Panel - Iron, TIBC and Ferritin Panel  6. Migraine without aura and without status migrainosus, not intractable May take rizatriptan  as needed for acute migraine.  - rizatriptan  (MAXALT -MLT) 10 MG disintegrating tablet; Take 1 tablet (10 mg total) by mouth daily as needed for migraine. May repeat in 2 hours if needed x1 dose  Dispense: 10 tablet; Refill: 0  7. B12 deficiency Routine labs ordered  - CBC with Differential/Platelet - B12 and Folate Panel - Iron, TIBC and Ferritin Panel  8. Vitamin D  deficiency Routine lab ordered  - Vitamin D  (25 hydroxy)  9. GAD (generalized anxiety disorder) Continue prn alprazolam  as prescribed.  - ALPRAZolam  (XANAX ) 0.25 MG tablet; Take 1 tablet (0.25 mg total) by mouth at bedtime as needed for anxiety.  Dispense: 30 tablet; Refill: 1     General Counseling: Lequisha verbalizes understanding of the findings of todays visit and agrees with plan of treatment. I have discussed any further diagnostic evaluation that may be needed or ordered today. We also reviewed her medications today. she has been encouraged to call the office with any questions or concerns that should arise related to todays visit.    Orders Placed This Encounter  Procedures   CBC with Differential/Platelet   CMP14+EGFR   Lipid Profile   Vitamin D  (25 hydroxy)   TSH + free T4   B12 and Folate Panel   Iron, TIBC and Ferritin Panel    Meds ordered this encounter  Medications   rizatriptan  (MAXALT -MLT) 10 MG disintegrating tablet    Sig: Take 1 tablet (10 mg total) by mouth daily as needed for migraine. May repeat in 2 hours if needed x1 dose    Dispense:  10 tablet    Refill:  0    Fill new script today   ALPRAZolam  (XANAX ) 0.25 MG tablet    Sig: Take 1  tablet (0.25 mg total) by mouth at bedtime as needed for anxiety.    Dispense:  30 tablet    Refill:  1    Return in about 4 weeks (around 08/24/2023) for F/U, Labs, Rolin Schult PCP orthostatic hypotension.   Total time spent:30 Minutes Time spent includes review of chart, medications, test results, and follow up plan with the patient.   [place] Controlled Substance Database was reviewed by me.  This patient was seen by Laurence Pons, FNP-C in collaboration with Dr. Verneta Gone as a part of collaborative care agreement.  Merced Brougham R. Bobbi Burow, MSN, FNP-C Internal medicine

## 2023-08-18 DIAGNOSIS — D649 Anemia, unspecified: Secondary | ICD-10-CM | POA: Diagnosis not present

## 2023-08-18 DIAGNOSIS — E782 Mixed hyperlipidemia: Secondary | ICD-10-CM | POA: Diagnosis not present

## 2023-08-18 DIAGNOSIS — E538 Deficiency of other specified B group vitamins: Secondary | ICD-10-CM | POA: Diagnosis not present

## 2023-08-18 DIAGNOSIS — D519 Vitamin B12 deficiency anemia, unspecified: Secondary | ICD-10-CM | POA: Diagnosis not present

## 2023-08-18 DIAGNOSIS — E559 Vitamin D deficiency, unspecified: Secondary | ICD-10-CM | POA: Diagnosis not present

## 2023-08-18 DIAGNOSIS — E039 Hypothyroidism, unspecified: Secondary | ICD-10-CM | POA: Diagnosis not present

## 2023-08-19 LAB — CBC WITH DIFFERENTIAL/PLATELET
Basophils Absolute: 0 10*3/uL (ref 0.0–0.2)
Basos: 1 %
EOS (ABSOLUTE): 0.2 10*3/uL (ref 0.0–0.4)
Eos: 4 %
Hematocrit: 36.8 % (ref 34.0–46.6)
Hemoglobin: 12.1 g/dL (ref 11.1–15.9)
Immature Grans (Abs): 0 10*3/uL (ref 0.0–0.1)
Immature Granulocytes: 0 %
Lymphocytes Absolute: 1.7 10*3/uL (ref 0.7–3.1)
Lymphs: 32 %
MCH: 30.9 pg (ref 26.6–33.0)
MCHC: 32.9 g/dL (ref 31.5–35.7)
MCV: 94 fL (ref 79–97)
Monocytes Absolute: 0.5 10*3/uL (ref 0.1–0.9)
Monocytes: 9 %
Neutrophils Absolute: 2.9 10*3/uL (ref 1.4–7.0)
Neutrophils: 54 %
Platelets: 267 10*3/uL (ref 150–450)
RBC: 3.91 x10E6/uL (ref 3.77–5.28)
RDW: 12 % (ref 11.7–15.4)
WBC: 5.4 10*3/uL (ref 3.4–10.8)

## 2023-08-19 LAB — LIPID PANEL
Chol/HDL Ratio: 3 ratio (ref 0.0–4.4)
Cholesterol, Total: 170 mg/dL (ref 100–199)
HDL: 57 mg/dL (ref >39)
LDL Chol Calc (NIH): 100 mg/dL — ABNORMAL HIGH (ref 0–99)
Triglycerides: 70 mg/dL (ref 0–149)
VLDL Cholesterol Cal: 13 mg/dL (ref 5–40)

## 2023-08-19 LAB — CMP14+EGFR
ALT: 12 IU/L (ref 0–32)
AST: 14 IU/L (ref 0–40)
Albumin: 4.5 g/dL (ref 4.0–5.0)
Alkaline Phosphatase: 33 IU/L — ABNORMAL LOW (ref 44–121)
BUN/Creatinine Ratio: 21 (ref 9–23)
BUN: 18 mg/dL (ref 6–20)
Bilirubin Total: 0.4 mg/dL (ref 0.0–1.2)
CO2: 22 mmol/L (ref 20–29)
Calcium: 9.4 mg/dL (ref 8.7–10.2)
Chloride: 103 mmol/L (ref 96–106)
Creatinine, Ser: 0.85 mg/dL (ref 0.57–1.00)
Globulin, Total: 2.3 g/dL (ref 1.5–4.5)
Glucose: 66 mg/dL — ABNORMAL LOW (ref 70–99)
Potassium: 4.2 mmol/L (ref 3.5–5.2)
Sodium: 138 mmol/L (ref 134–144)
Total Protein: 6.8 g/dL (ref 6.0–8.5)
eGFR: 94 mL/min/{1.73_m2} (ref >59)

## 2023-08-19 LAB — B12 AND FOLATE PANEL
Folate: >20 ng/mL (ref >3.0)
Vitamin B-12: 700 pg/mL (ref 232–1245)

## 2023-08-19 LAB — TSH+FREE T4
Free T4: 1.27 ng/dL (ref 0.82–1.77)
TSH: 3.39 u[IU]/mL (ref 0.450–4.500)

## 2023-08-19 LAB — VITAMIN D 25 HYDROXY (VIT D DEFICIENCY, FRACTURES): Vit D, 25-Hydroxy: 57.1 ng/mL (ref 30.0–100.0)

## 2023-08-19 LAB — IRON,TIBC AND FERRITIN PANEL
Ferritin: 126 ng/mL (ref 15–150)
Iron Saturation: 26 % (ref 15–55)
Iron: 67 ug/dL (ref 27–159)
Total Iron Binding Capacity: 257 ug/dL (ref 250–450)
UIBC: 190 ug/dL (ref 131–425)

## 2023-08-24 ENCOUNTER — Ambulatory Visit: Payer: Self-pay | Admitting: Nurse Practitioner

## 2023-08-24 ENCOUNTER — Encounter: Payer: Self-pay | Admitting: Nurse Practitioner

## 2023-08-24 VITALS — BP 110/70 | HR 83 | Temp 98.3°F | Resp 16 | Ht 64.0 in | Wt 132.4 lb

## 2023-08-24 DIAGNOSIS — E162 Hypoglycemia, unspecified: Secondary | ICD-10-CM

## 2023-08-24 DIAGNOSIS — E7800 Pure hypercholesterolemia, unspecified: Secondary | ICD-10-CM | POA: Diagnosis not present

## 2023-08-24 DIAGNOSIS — R42 Dizziness and giddiness: Secondary | ICD-10-CM

## 2023-08-24 NOTE — Progress Notes (Signed)
 [facility] [address] [place], [place] [address]  Internal MEDICINE  Office Visit Note  Patient Name: Cassandra Stephens  [id number]  [id number]  Date of Service: 08/24/2023  Chief Complaint  Patient presents with   Depression   Follow-up    HPI Cassandra Stephens presents for a follow-up visit for lab results  Reviewed labs, no significant abnormals to explain dizziness.  Drinking more water,  Had episodes of low glucose as a child and would pass out but was told this is because she was still growing.      Current Medication: Outpatient Encounter Medications as of 08/24/2023  Medication Sig   ALPRAZolam  (XANAX ) 0.25 MG tablet Take 1 tablet (0.25 mg total) by mouth at bedtime as needed for anxiety.   amoxicillin -clavulanate (AUGMENTIN ) 875-125 MG tablet Take 1 tablet by mouth 2 (two) times daily.   b complex vitamins capsule Take 1 capsule by mouth daily.   cetirizine  (ZYRTEC ) 10 MG tablet Take 1 tablet (10 mg total) by mouth daily.   Cholecalciferol (VITAMIN D3) 50 MCG (2000 UT) TABS Take by mouth.   fluticasone  (FLONASE ) 50 MCG/ACT nasal spray Place 2 sprays into both nostrils daily.   ipratropium (ATROVENT ) 0.03 % nasal spray Place 2 sprays into both nostrils every 12 (twelve) hours.   montelukast  (SINGULAIR ) 10 MG tablet TAKE 1 TABLET BY MOUTH EVERYDAY AT BEDTIME   Multiple Vitamins-Minerals (MULTIVITAMIN WOMEN) TABS Take by mouth.   Probiotic Product (PROBIOTIC DAILY) CAPS Take by mouth.   rizatriptan  (MAXALT -MLT) 10 MG disintegrating tablet Take 1 tablet (10 mg total) by mouth daily as needed for migraine. May repeat in 2 hours if needed x1 dose   [DISCONTINUED] levothyroxine  (SYNTHROID ) 50 MCG tablet TAKE 1 TABLET BY MOUTH EVERY MORNING ON AN EMPTY STOMACH   No facility-administered encounter medications on file as of 08/24/2023.    Surgical History: Past Surgical History:  Procedure Laterality Date   TONSILECTOMY, ADENOIDECTOMY, BILATERAL MYRINGOTOMY AND TUBES      TONSILLECTOMY Bilateral     Medical History: Past Medical History:  Diagnosis Date   Anxiety    Depression    Herpes simplex virus (HSV) type I or type II DNA not detected by PCR    Thyroid  disease     Family History: Family History  Problem Relation Age of Onset   Hyperlipidemia Father    Hypertension Father    Heart attack Father    Diabetes Maternal Grandfather    Diabetes Paternal Grandfather    Heart disease Paternal Grandfather     Social History   Socioeconomic History   Marital status: Married    Spouse name: Not on file   Number of children: Not on file   Years of education: Not on file   Highest education level: Not on file  Occupational History   Not on file  Tobacco Use   Smoking status: Never   Smokeless tobacco: Never  Vaping Use   Vaping status: Never Used  Substance and Sexual Activity   Alcohol use: Yes    Comment: ocassionally   Drug use: Never   Sexual activity: Not Currently    Birth control/protection: None  Other Topics Concern   Not on file  Social History Narrative   Not on file   Social Drivers of Health   Financial Resource Strain: Low Risk  (03/30/2018)   Overall Financial Resource Strain (CARDIA)    Difficulty of Paying Living Expenses: Not hard at all  Food Insecurity: No Food Insecurity (03/30/2018)  Hunger Vital Sign    Worried About Running Out of Food in the Last Year: Never true    Ran Out of Food in the Last Year: Never true  Transportation Needs: No Transportation Needs (03/30/2018)   PRAPARE - Administrator, Civil Service (Medical): No    Lack of Transportation (Non-Medical): No  Physical Activity: Inactive (03/30/2018)   Exercise Vital Sign    Days of Exercise per Week: 0 days    Minutes of Exercise per Session: 0 min  Stress: No Stress Concern Present (03/30/2018)   Cassandra Stephens of Occupational Health - Occupational Stress Questionnaire    Feeling of Stress : Not at all  Social  Connections: Somewhat Isolated (03/30/2018)   Social Connection and Isolation Panel [NHANES]    Frequency of Communication with Friends and Family: More than three times a week    Frequency of Social Gatherings with Friends and Family: More than three times a week    Attends Religious Services: Never    Database administrator or Organizations: No    Attends Banker Meetings: Never    Marital Status: Married  Catering manager Violence: Not At Risk (03/30/2018)   Humiliation, Afraid, Rape, and Kick questionnaire    Fear of Current or Ex-Partner: No    Emotionally Abused: No    Physically Abused: No    Sexually Abused: No      Review of Systems  Constitutional:  Positive for fatigue. Negative for chills and unexpected weight change.  HENT:  Negative for congestion, postnasal drip, rhinorrhea, sneezing and sore throat.   Eyes:  Negative for redness.  Respiratory:  Negative for cough, chest tightness and shortness of breath.   Cardiovascular:  Negative for chest pain and palpitations.  Gastrointestinal:  Positive for abdominal distention and constipation. Negative for abdominal pain, blood in stool, diarrhea, nausea and vomiting.  Genitourinary:  Negative for dysuria and frequency.  Musculoskeletal:  Negative for arthralgias, back pain, joint swelling and neck pain.  Skin:  Negative for rash.  Neurological:  Positive for dizziness and light-headedness. Negative for tremors and numbness.  Hematological:  Negative for adenopathy. Does not bruise/bleed easily.  Psychiatric/Behavioral:  Negative for behavioral problems (Depression), sleep disturbance and suicidal ideas. The patient is nervous/anxious.     Vital Signs: BP 110/70   Pulse 83   Temp 98.3 F (36.8 C)   Resp 16   Ht 5\' 4"  (1.626 m)   Wt 132 lb 6.4 oz (60.1 kg)   SpO2 96%   BMI 22.73 kg/m    Physical Exam Vitals reviewed.  Constitutional:      General: She is not in acute distress.    Appearance: Normal  appearance. She is normal weight. She is not ill-appearing.  HENT:     Head: Normocephalic and atraumatic.  Eyes:     Pupils: Pupils are equal, round, and reactive to light.  Cardiovascular:     Rate and Rhythm: Normal rate and regular rhythm.  Pulmonary:     Effort: Pulmonary effort is normal. No respiratory distress.  Neurological:     Mental Status: She is alert and oriented to person, place, and time.  Psychiatric:        Mood and Affect: Mood normal.        Behavior: Behavior normal.        Assessment/Plan: 1. Hypoglycemia without diagnosis of diabetes mellitus (Primary) Referred to endocrinology  - Ambulatory referral to Endocrinology  2. Dizziness Referred to endocrinology  3. Elevated low density lipoprotein (LDL) cholesterol level Limit red meat and increase lean proteins in diet.    General Counseling: kaity pitstick understanding of the findings of todays visit and agrees with plan of treatment. I have discussed any further diagnostic evaluation that may be needed or ordered today. We also reviewed her medications today. she has been encouraged to call the office with any questions or concerns that should arise related to todays visit.    Orders Placed This Encounter  Procedures   Ambulatory referral to Endocrinology    No orders of the defined types were placed in this encounter.   Return if symptoms worsen or fail to improve, for referred to endocrinology.   Total time spent:30 Minutes Time spent includes review of chart, medications, test results, and follow up plan with the patient.   [place] Controlled Substance Database was reviewed by me.  This patient was seen by Laurence Pons, FNP-C in collaboration with Dr. Verneta Gone as a part of collaborative care agreement.   Donis Kotowski R. Bobbi Burow, MSN, FNP-C Internal medicine

## 2023-08-25 ENCOUNTER — Telehealth: Payer: Self-pay | Admitting: Nurse Practitioner

## 2023-08-25 NOTE — Telephone Encounter (Signed)
 Awaiting 08/24/23 office notes for Endocrinology referral-Toni

## 2023-08-30 ENCOUNTER — Encounter: Payer: Self-pay | Admitting: Nurse Practitioner

## 2023-08-30 DIAGNOSIS — D649 Anemia, unspecified: Secondary | ICD-10-CM | POA: Insufficient documentation

## 2023-08-30 DIAGNOSIS — G43009 Migraine without aura, not intractable, without status migrainosus: Secondary | ICD-10-CM | POA: Insufficient documentation

## 2023-08-30 DIAGNOSIS — E039 Hypothyroidism, unspecified: Secondary | ICD-10-CM | POA: Insufficient documentation

## 2023-09-06 ENCOUNTER — Other Ambulatory Visit: Payer: Self-pay | Admitting: Nurse Practitioner

## 2023-09-06 DIAGNOSIS — E039 Hypothyroidism, unspecified: Secondary | ICD-10-CM

## 2023-09-07 ENCOUNTER — Encounter: Payer: Self-pay | Admitting: Nurse Practitioner

## 2023-09-08 ENCOUNTER — Other Ambulatory Visit: Payer: Self-pay

## 2023-09-08 DIAGNOSIS — E039 Hypothyroidism, unspecified: Secondary | ICD-10-CM

## 2023-09-08 MED ORDER — LEVOTHYROXINE SODIUM 50 MCG PO TABS: ORAL_TABLET | ORAL | 1 refills | Status: DC

## 2023-09-13 ENCOUNTER — Encounter: Payer: Self-pay | Admitting: Nurse Practitioner

## 2023-09-15 ENCOUNTER — Telehealth: Payer: Self-pay | Admitting: Nurse Practitioner

## 2023-09-15 NOTE — Telephone Encounter (Signed)
 Endocrinology referral sent via [facility] to [facility].  Lvm notifying patient. Gave pt telephone# [phone]

## 2023-10-05 ENCOUNTER — Telehealth: Payer: Self-pay | Admitting: Nurse Practitioner

## 2023-10-05 NOTE — Telephone Encounter (Signed)
 Endocrinology appointment 11/24/2023 w/ [facility]-Toni

## 2023-12-16 DIAGNOSIS — D485 Neoplasm of uncertain behavior of skin: Secondary | ICD-10-CM | POA: Diagnosis not present

## 2023-12-16 DIAGNOSIS — D225 Melanocytic nevi of trunk: Secondary | ICD-10-CM | POA: Diagnosis not present

## 2023-12-16 DIAGNOSIS — D2262 Melanocytic nevi of left upper limb, including shoulder: Secondary | ICD-10-CM | POA: Diagnosis not present

## 2023-12-16 DIAGNOSIS — D2261 Melanocytic nevi of right upper limb, including shoulder: Secondary | ICD-10-CM | POA: Diagnosis not present

## 2023-12-16 DIAGNOSIS — D2271 Melanocytic nevi of right lower limb, including hip: Secondary | ICD-10-CM | POA: Diagnosis not present

## 2024-01-09 ENCOUNTER — Encounter: Payer: Self-pay | Admitting: Emergency Medicine

## 2024-01-09 ENCOUNTER — Ambulatory Visit
Admission: EM | Admit: 2024-01-09 | Discharge: 2024-01-09 | Disposition: A | Discharge status: Home / Self Care | Attending: Emergency Medicine | Admitting: Emergency Medicine

## 2024-01-09 DIAGNOSIS — J019 Acute sinusitis, unspecified: Secondary | ICD-10-CM | POA: Diagnosis not present

## 2024-01-09 DIAGNOSIS — B9689 Other specified bacterial agents as the cause of diseases classified elsewhere: Secondary | ICD-10-CM

## 2024-01-09 LAB — POC SOFIA SARS ANTIGEN FIA: SARS Coronavirus 2 Ag: NEGATIVE

## 2024-01-09 MED ORDER — AMOXICILLIN-POT CLAVULANATE 875-125 MG PO TABS: 1.0000 | ORAL_TABLET | Freq: Two times a day (BID) | ORAL | 0 refills | Status: DC

## 2024-01-09 MED ORDER — PREDNISONE 10 MG (21) PO TBPK: ORAL_TABLET | Freq: Every day | ORAL | 0 refills | Status: AC

## 2024-01-09 NOTE — ED Provider Notes (Signed)
 Cassandra Stephens    CSN: [id number] Arrival date & time: 01/09/24  0847      History   Chief Complaint Chief Complaint  Patient presents with   Nasal Congestion   Hoarse    HPI Cassandra Stephens is a 33 y.o. female.   Dents for evaluation of nasal congestion for the past 3 weeks, over the last 3 days has begun to experience a scratchy throat with hoarseness and ear popping to the right side.  Initially thought symptoms to be allergies and has been taking antihistamines, Flonase .  No known sick contacts.  Denies fever, cough, shortness of breath or wheezing, sinus pain or pressure.  Past Medical History:  Diagnosis Date   Anxiety    Depression    Herpes simplex virus (HSV) type I or type II DNA not detected by PCR    Thyroid  disease     Patient Active Problem List   Diagnosis Date Noted   Acquired hypothyroidism 08/30/2023   Anemia 08/30/2023   Migraine without aura and without status migrainosus, not intractable 08/30/2023   Other fatigue 01/22/2020   Subclinical hypothyroidism 01/20/2019   Mixed hyperlipidemia 01/20/2019   Encounter for well adult exam with abnormal findings 01/05/2019   Mastitis, right, acute 01/05/2019   Vitamin D  deficiency 05/13/2018    Past Surgical History:  Procedure Laterality Date   TONSILECTOMY, ADENOIDECTOMY, BILATERAL MYRINGOTOMY AND TUBES     TONSILLECTOMY Bilateral     OB History     Gravida  1   Para  1   Term  1   Preterm  0   AB  0   Living  1      SAB  0   IAB  0   Ectopic  0   Multiple  0   Live Births  1            Home Medications    Prior to Admission medications   Medication Sig Start Date End Date Taking? Authorizing Provider  predniSONE  (STERAPRED UNI-PAK 21 TAB) 10 MG (21) TBPK tablet Take by mouth daily. Take 6 tabs by mouth daily  for 1 days, then 5 tabs for 1 days, then 4 tabs for 1 days, then 3 tabs for 1 days, 2 tabs for 1 days, then 1 tab by mouth daily for 1 days 01/09/24  Yes  Jeannette Maddy R, NP  ALPRAZolam  (XANAX ) 0.25 MG tablet Take 1 tablet (0.25 mg total) by mouth at bedtime as needed for anxiety. 07/27/23   Liana Fish, NP  amoxicillin -clavulanate (AUGMENTIN ) 875-125 MG tablet Take 1 tablet by mouth 2 (two) times daily. 01/09/24   Teresa Shelba SAUNDERS, NP  b complex vitamins capsule Take 1 capsule by mouth daily.    [provider]  cetirizine  (ZYRTEC ) 10 MG tablet Take 1 tablet (10 mg total) by mouth daily. 07/15/22   Abernathy, Alyssa, NP  Cholecalciferol (VITAMIN D3) 50 MCG (2000 UT) TABS Take by mouth.    [provider]  fluticasone  (FLONASE ) 50 MCG/ACT nasal spray Place 2 sprays into both nostrils daily. 06/02/21   Lavell Lye A, FNP  ipratropium (ATROVENT ) 0.03 % nasal spray Place 2 sprays into both nostrils every 12 (twelve) hours. 05/25/22   Fleming, Zelda W, NP  levothyroxine  (SYNTHROID ) 50 MCG tablet TAKE 1 TABLET BY MOUTH EVERY MORNING ON AN EMPTY STOMACH 09/08/23   Abernathy, Alyssa, NP  montelukast  (SINGULAIR ) 10 MG tablet TAKE 1 TABLET BY MOUTH EVERYDAY AT BEDTIME 06/12/23   Abernathy,  Alyssa, NP  Multiple Vitamins-Minerals (MULTIVITAMIN WOMEN) TABS Take by mouth.    [provider]  Probiotic Product (PROBIOTIC DAILY) CAPS Take by mouth.    [provider]  rizatriptan  (MAXALT -MLT) 10 MG disintegrating tablet Take 1 tablet (10 mg total) by mouth daily as needed for migraine. May repeat in 2 hours if needed x1 dose 07/27/23   Liana Fish, NP    Family History Family History  Problem Relation Age of Onset   Hyperlipidemia Father    Hypertension Father    Heart attack Father    Diabetes Maternal Grandfather    Diabetes Paternal Grandfather    Heart disease Paternal Grandfather     Social History Social History   Tobacco Use   Smoking status: Never   Smokeless tobacco: Never  Vaping Use   Vaping status: Never Used  Substance Use Topics   Alcohol use: Yes    Comment: ocassionally   Drug use:  Never     Allergies   Patient has no known allergies.   Review of Systems Review of Systems   Physical Exam Triage Vital Signs ED Triage Vitals  Encounter Vitals Group     BP 01/09/24 0901 100/71     Girls Systolic BP Percentile --      Girls Diastolic BP Percentile --      Boys Systolic BP Percentile --      Boys Diastolic BP Percentile --      Pulse Rate 01/09/24 0901 89     Resp 01/09/24 0901 18     Temp 01/09/24 0901 98.6 F (37 C)     Temp Source 01/09/24 0901 Oral     SpO2 01/09/24 0901 96 %     Weight --      Height --      Head Circumference --      Peak Flow --      Pain Score 01/09/24 0906 0     Pain Loc --      Pain Education --      Exclude from Growth Chart --    No data found.  Updated Vital Signs BP 100/71 (BP Location: Left Arm)   Pulse 89   Temp 98.6 F (37 C) (Oral)   Resp 18   LMP 12/17/2023 (Exact Date)   SpO2 96%   Visual Acuity Right Eye Distance:   Left Eye Distance:   Bilateral Distance:    Right Eye Near:   Left Eye Near:    Bilateral Near:     Physical Exam Constitutional:      Appearance: Normal appearance.  HENT:     Head: Normocephalic.     Right Ear: Tympanic membrane, ear canal and external ear normal.     Left Ear: Tympanic membrane, ear canal and external ear normal.     Nose: Congestion present.     Mouth/Throat:     Mouth: Mucous membranes are moist.     Pharynx: Oropharynx is clear.  Eyes:     Extraocular Movements: Extraocular movements intact.  Cardiovascular:     Rate and Rhythm: Normal rate and regular rhythm.     Pulses: Normal pulses.     Heart sounds: Normal heart sounds.  Pulmonary:     Effort: Pulmonary effort is normal.     Breath sounds: Normal breath sounds.  Neurological:     Mental Status: She is alert and oriented to person, place, and time. Mental status is at baseline.  UC Treatments / Results  Labs (all labs ordered are listed, but only abnormal results are displayed) Labs  Reviewed  POC SOFIA SARS ANTIGEN FIA - Normal    EKG   Radiology No results found.  Procedures Procedures (including critical care time)  Medications Ordered in UC Medications - No data to display  Initial Impression / Assessment and Plan / UC Course  I have reviewed the triage vital signs and the nursing notes.  Pertinent labs & imaging results that were available during my care of the patient were reviewed by me and considered in my medical decision making (see chart for details).  Acute nonrecurrent sinusitis  Patient is in no signs of distress nor toxic appearing.  Vital signs are stable.  Low suspicion for pneumonia, pneumothorax or bronchitis and therefore will defer imaging.  COVID testing negative.  New symptoms beginning 3 days ago, possibly exacerbation of congestion that has persisted for 3 weeks versus new viral process, empirically placed on Augmentin  and prednisone .May use additional over-the-counter medications as needed for supportive care.  May follow-up with urgent care as needed if symptoms persist or worsen.  Final Clinical Impressions(s) / UC Diagnoses   Final diagnoses:  Viral illness  Acute non-recurrent sinusitis, unspecified location     Discharge Instructions      Today you are being treated for potential sinus infection  Begin Augmentin  twice daily for 7 days for bacterial coverage  Begin prednisone  every morning with food, take as directed, avoid ibuprofen  during use but may use Tylenol  if needed  COVID testing negative    You can take Tylenol  as needed for fever reduction and pain relief.   For cough: honey 1/2 to 1 teaspoon (you can dilute the honey in water or another fluid).  You can also use guaifenesin and dextromethorphan for cough. You can use a humidifier for chest congestion and cough.  If you don't have a humidifier, you can sit in the bathroom with the hot shower running.      For sore throat: try warm salt water gargles, cepacol  lozenges, throat spray, warm tea or water with lemon/honey, popsicles or ice, or OTC cold relief medicine for throat discomfort.   For congestion: take a daily anti-histamine like Zyrtec , Claritin, and a oral decongestant, such as pseudoephedrine.  You can also use Flonase  1-2 sprays in each nostril daily.   It is important to stay hydrated: drink plenty of fluids (water, gatorade/powerade/pedialyte, juices, or teas) to keep your throat moisturized and help further relieve irritation/discomfort.    ED Prescriptions     Medication Sig Dispense Auth. Provider   amoxicillin -clavulanate (AUGMENTIN ) 875-125 MG tablet Take 1 tablet by mouth 2 (two) times daily. 14 tablet Fredrick Geoghegan R, NP   predniSONE  (STERAPRED UNI-PAK 21 TAB) 10 MG (21) TBPK tablet Take by mouth daily. Take 6 tabs by mouth daily  for 1 days, then 5 tabs for 1 days, then 4 tabs for 1 days, then 3 tabs for 1 days, 2 tabs for 1 days, then 1 tab by mouth daily for 1 days 21 tablet Aisia Correira, Shelba SAUNDERS, NP      PDMP not reviewed this encounter.   Teresa Shelba SAUNDERS, NP 01/09/24 1427

## 2024-01-09 NOTE — ED Triage Notes (Signed)
 Patient states My allergies has gotten out of control x 3 weeks ago. . Patient now complains of post nasal drip that has been manage with her allergy medication. Patient now complains of hoarseness that started 3 days ago. Patient also states my ears are popping. Denies pain at this time.

## 2024-01-09 NOTE — Discharge Instructions (Signed)
 Today you are being treated for potential sinus infection  Begin Augmentin  twice daily for 7 days for bacterial coverage  Begin prednisone  every morning with food, take as directed, avoid ibuprofen  during use but may use Tylenol  if needed  COVID testing negative    You can take Tylenol  as needed for fever reduction and pain relief.   For cough: honey 1/2 to 1 teaspoon (you can dilute the honey in water or another fluid).  You can also use guaifenesin and dextromethorphan for cough. You can use a humidifier for chest congestion and cough.  If you don't have a humidifier, you can sit in the bathroom with the hot shower running.      For sore throat: try warm salt water gargles, cepacol lozenges, throat spray, warm tea or water with lemon/honey, popsicles or ice, or OTC cold relief medicine for throat discomfort.   For congestion: take a daily anti-histamine like Zyrtec , Claritin, and a oral decongestant, such as pseudoephedrine.  You can also use Flonase  1-2 sprays in each nostril daily.   It is important to stay hydrated: drink plenty of fluids (water, gatorade/powerade/pedialyte, juices, or teas) to keep your throat moisturized and help further relieve irritation/discomfort.

## 2024-01-27 DIAGNOSIS — D2261 Melanocytic nevi of right upper limb, including shoulder: Secondary | ICD-10-CM | POA: Diagnosis not present

## 2024-02-29 ENCOUNTER — Other Ambulatory Visit: Payer: Self-pay | Admitting: Nurse Practitioner

## 2024-02-29 DIAGNOSIS — E039 Hypothyroidism, unspecified: Secondary | ICD-10-CM

## 2024-04-15 ENCOUNTER — Telehealth: Admitting: Physician Assistant

## 2024-04-15 DIAGNOSIS — B9689 Other specified bacterial agents as the cause of diseases classified elsewhere: Secondary | ICD-10-CM | POA: Diagnosis not present

## 2024-04-15 DIAGNOSIS — J019 Acute sinusitis, unspecified: Secondary | ICD-10-CM | POA: Diagnosis not present

## 2024-04-15 MED ORDER — AMOXICILLIN-POT CLAVULANATE 875-125 MG PO TABS: 1.0000 | ORAL_TABLET | Freq: Two times a day (BID) | ORAL | 0 refills | Status: DC

## 2024-04-15 NOTE — Progress Notes (Signed)

## 2024-05-18 ENCOUNTER — Telehealth: Admitting: Physician Assistant

## 2024-05-18 DIAGNOSIS — B9689 Other specified bacterial agents as the cause of diseases classified elsewhere: Secondary | ICD-10-CM

## 2024-05-18 MED ORDER — FLUTICASONE PROPIONATE 50 MCG/ACT NA SUSP: 2.0000 | Freq: Every day | NASAL | 0 refills | Status: AC

## 2024-05-18 MED ORDER — DOXYCYCLINE HYCLATE 100 MG PO TABS: 100.0000 mg | ORAL_TABLET | Freq: Two times a day (BID) | ORAL | 0 refills | Status: AC

## 2024-05-18 NOTE — Progress Notes (Signed)
 E-Visit for Sinus Problems  We are sorry that you are not feeling well.  Here is how we plan to help!  Based on what you have shared with me it looks like you have sinusitis.  Sinusitis is inflammation and infection in the sinus cavities of the head.  Based on your presentation I believe you most likely have Acute Bacterial Sinusitis.  This is an infection caused by bacteria and is treated with antibiotics. I have prescribed Doxycycline  100mg  by mouth twice a day for 7 days. and I have also prescribed Flonase  Nasal Spray Use 2 sprays in each nostril daily for 10-14 days You may use an oral decongestant such as Mucinex D or if you have glaucoma or high blood pressure use plain Mucinex. Saline nasal spray help and can safely be used as often as needed for congestion.  If you develop worsening sinus pain, fever or notice severe headache and vision changes, or if symptoms are not better after completion of antibiotic, please schedule an appointment with a health care provider.    Sinus infections are not as easily transmitted as other respiratory infection, however we still recommend that you avoid close contact with loved ones, especially the very young and elderly.  Remember to wash your hands thoroughly throughout the day as this is the number one way to prevent the spread of infection!  Home Care: Only take medications as instructed by your medical team. Complete the entire course of an antibiotic. Do not take these medications with alcohol. A steam or ultrasonic humidifier can help congestion.  You can place a towel over your head and breathe in the steam from hot water coming from a faucet. Avoid close contacts especially the very young and the elderly. Cover your mouth when you cough or sneeze. Always remember to wash your hands.  Get Help Right Away If: You develop worsening fever or sinus pain. You develop a severe head ache or visual changes. Your symptoms persist after you have  completed your treatment plan.  Make sure you Understand these instructions. Will watch your condition. Will get help right away if you are not doing well or get worse.  Your e-visit answers were reviewed by a board certified advanced clinical practitioner to complete your personal care plan.  Depending on the condition, your plan could have included both over the counter or prescription medications.  If there is a problem please reply  once you have received a response from your provider.  Your safety is important to us .  If you have drug allergies check your prescription carefully.    You can use MyChart to ask questions about todays visit, request a non-urgent call back, or ask for a work or school excuse for 24 hours related to this e-Visit. If it has been greater than 24 hours you will need to follow up with your provider, or enter a new e-Visit to address those concerns.  You will get an e-mail in the next two days asking about your experience.  I hope that your e-visit has been valuable and will speed your recovery. Thank you for using e-visits.  I have spent 5 minutes in review of e-visit questionnaire, review and updating patient chart, medical decision making and response to patient.   Delon CHRISTELLA Dickinson, PA-C

## 2024-07-27 ENCOUNTER — Other Ambulatory Visit: Payer: Self-pay | Admitting: Nurse Practitioner
